# Patient Record
Sex: Female | Born: 1990 | Hispanic: No | Marital: Married | Smoking: Never smoker
Health system: Southern US, Community
[De-identification: ages and names within clinical notes are randomized; demographics above are authoritative.]

## PROBLEM LIST (undated history)

## (undated) ENCOUNTER — Inpatient Hospital Stay (HOSPITAL_COMMUNITY): Payer: Self-pay

## (undated) DIAGNOSIS — G629 Polyneuropathy, unspecified: Secondary | ICD-10-CM

## (undated) HISTORY — PX: CHOLECYSTECTOMY: SHX55

---

## 2011-07-05 ENCOUNTER — Emergency Department (HOSPITAL_COMMUNITY)
Admission: EM | Admit: 2011-07-05 | Discharge: 2011-07-05 | Disposition: A | Discharge status: Home / Self Care | Payer: Medicaid Other | Attending: Emergency Medicine | Admitting: Emergency Medicine

## 2011-07-05 ENCOUNTER — Emergency Department (HOSPITAL_COMMUNITY): Payer: Medicaid Other

## 2011-07-05 ENCOUNTER — Encounter (HOSPITAL_COMMUNITY): Payer: Self-pay

## 2011-07-05 DIAGNOSIS — O210 Mild hyperemesis gravidarum: Secondary | ICD-10-CM | POA: Insufficient documentation

## 2011-07-05 DIAGNOSIS — R5381 Other malaise: Secondary | ICD-10-CM | POA: Insufficient documentation

## 2011-07-05 DIAGNOSIS — N390 Urinary tract infection, site not specified: Secondary | ICD-10-CM | POA: Insufficient documentation

## 2011-07-05 DIAGNOSIS — IMO0001 Reserved for inherently not codable concepts without codable children: Secondary | ICD-10-CM | POA: Insufficient documentation

## 2011-07-05 DIAGNOSIS — O219 Vomiting of pregnancy, unspecified: Secondary | ICD-10-CM

## 2011-07-05 DIAGNOSIS — O2390 Unspecified genitourinary tract infection in pregnancy, unspecified trimester: Secondary | ICD-10-CM | POA: Insufficient documentation

## 2011-07-05 DIAGNOSIS — O2340 Unspecified infection of urinary tract in pregnancy, unspecified trimester: Secondary | ICD-10-CM

## 2011-07-05 LAB — URINALYSIS, ROUTINE W REFLEX MICROSCOPIC
Glucose, UA: NEGATIVE mg/dL
Hgb urine dipstick: NEGATIVE
Ketones, ur: >80 mg/dL — AB
Protein, ur: 30 mg/dL — AB
pH: 6 (ref 5.0–8.0)

## 2011-07-05 LAB — URINE CULTURE: Colony Count: >=100000

## 2011-07-05 LAB — CBC
MCV: 76.7 fL — ABNORMAL LOW (ref 78.0–100.0)
Platelets: 313 10*3/uL (ref 150–400)
RBC: 4.21 MIL/uL (ref 3.87–5.11)
RDW: 14.7 % (ref 11.5–15.5)
WBC: 10.2 10*3/uL (ref 4.0–10.5)

## 2011-07-05 LAB — URINE MICROSCOPIC-ADD ON

## 2011-07-05 LAB — COMPREHENSIVE METABOLIC PANEL
ALT: 8 U/L (ref 0–35)
AST: 14 U/L (ref 0–37)
Albumin: 3.5 g/dL (ref 3.5–5.2)
CO2: 24 mEq/L (ref 19–32)
Chloride: 102 mEq/L (ref 96–112)
Creatinine, Ser: 0.7 mg/dL (ref 0.50–1.10)
GFR calc non Af Amer: >90 mL/min (ref >90)
Potassium: 3.2 mEq/L — ABNORMAL LOW (ref 3.5–5.1)
Sodium: 135 mEq/L (ref 135–145)
Total Bilirubin: 0.3 mg/dL (ref 0.3–1.2)

## 2011-07-05 MED ORDER — ONDANSETRON HCL 4 MG PO TABS: 4.0000 mg | ORAL_TABLET | Freq: Four times a day (QID) | ORAL | Status: DC

## 2011-07-05 MED ORDER — DEXTROSE 5 % IV SOLN
1.0000 g | Freq: Once | INTRAVENOUS | Status: AC
  Administered 2011-07-05: 1 g via INTRAVENOUS
  Filled 2011-07-05: qty 10

## 2011-07-05 MED ORDER — CEPHALEXIN 500 MG PO CAPS: 500.0000 mg | ORAL_CAPSULE | Freq: Four times a day (QID) | ORAL | Status: AC

## 2011-07-05 MED ORDER — POTASSIUM CHLORIDE CRYS ER 20 MEQ PO TBCR
40.0000 meq | EXTENDED_RELEASE_TABLET | Freq: Once | ORAL | Status: AC
  Administered 2011-07-05: 40 meq via ORAL
  Filled 2011-07-05: qty 2

## 2011-07-05 MED ORDER — SODIUM CHLORIDE 0.9 % IV BOLUS (SEPSIS)
1000.0000 mL | Freq: Once | INTRAVENOUS | Status: AC
  Administered 2011-07-05: 1000 mL via INTRAVENOUS

## 2011-07-05 MED ORDER — ONDANSETRON HCL 4 MG/2ML IJ SOLN
4.0000 mg | Freq: Once | INTRAMUSCULAR | Status: AC
  Administered 2011-07-05: 4 mg via INTRAVENOUS
  Filled 2011-07-05: qty 2

## 2011-07-05 MED ORDER — PRENATAL COMPLETE 14-0.4 MG PO TABS: 1.0000 | ORAL_TABLET | Freq: Every day | ORAL | Status: DC

## 2011-07-05 NOTE — ED Notes (Signed)
Pt. States N/V x 3 days.  Pt. Aox 3, Skin warm dry, pink, denies CP, c/o abd pain, no noted respiratory  Distress.

## 2011-07-05 NOTE — ED Notes (Signed)
Ns bolus 1 liter complete

## 2011-07-05 NOTE — ED Notes (Signed)
Pt. Given sprite Po challenge. Visitors at bedside given beverage as well.

## 2011-07-05 NOTE — ED Provider Notes (Signed)
History     CSN: [id number]  Arrival date & time 07/05/11  0113   First MD Initiated Contact with Patient 07/05/11 0120      Chief Complaint  Patient presents with  . Emesis    pt. c/o vomiting x 3 days    (Consider location/radiation/quality/duration/timing/severity/associated sxs/prior treatment) HPI Patient presents with complaint of nausea and vomiting over the past 2-3 days. She states that she generally felt more tired than usual with diffuse body aches and has had multiple episodes of emesis. Emesis nonbloody and nonbilious. She's had no fever or chills. She denies abdominal pain. She also denies vaginal bleeding or discharge. Her last menstrual period was approximately 6 weeks ago. There are no alleviating or modifying factors. There no other associated systemic symptoms. Symptoms are continuous and described as moderate. She's not had similar symptoms previously. There no known sick contacts.  No past medical history on file.  No past surgical history on file.  No family history on file.  History  Substance Use Topics  . Smoking status: Never Smoker   . Smokeless tobacco: Not on file  . Alcohol Use: No    OB History    Grav Para Term Preterm Abortions TAB SAB Ect Mult Living                  Review of Systems ROS reviewed and otherwise negative except for mentioned in HPI  Allergies  Review of patient's allergies indicates no known allergies.  Home Medications   Current Outpatient Rx  Name Route Sig Dispense Refill  . NYQUIL PO Oral Take 30 mLs by mouth once.    . CEPHALEXIN 500 MG PO CAPS Oral Take 1 capsule (500 mg total) by mouth 4 (four) times daily. 28 capsule 0  . ONDANSETRON HCL 4 MG PO TABS Oral Take 1 tablet (4 mg total) by mouth every 6 (six) hours. 12 tablet 0  . PRENATAL COMPLETE 14-0.4 MG PO TABS Oral Take 1 tablet by mouth daily. 60 each 0    BP 94/54  Pulse 62  Temp(Src) 97.8 F (36.6 C) (Oral)  Resp 18  SpO2 100% Vitals  reviewed Physical Exam Physical Examination: General appearance - alert, well appearing, and in no distress Mental status - alert, oriented to person, place, and time Eyes - pupils equal and reactive, no scleral icterus Mouth - mucous membranes moist, pharynx normal without lesions Chest - clear to auscultation, no wheezes, rales or rhonchi, symmetric air entry Heart - normal rate, regular rhythm, normal S1, S2, no murmurs, rubs, clicks or gallops Abdomen - soft, nontender, nondistended, no masses or organomegaly, normal active bowel sounds Musculoskeletal - no joint tenderness, deformity or swelling Extremities - peripheral pulses normal, no pedal edema, no clubbing or cyanosis Skin - normal coloration and turgor, no rashes  ED Course  Procedures (including critical care time)  5:20 AM pt has tolerated fluids, nausea much improved.  No abdominal or flank pain.  U/s shows + IUP.  Urinalysis c/w UTI- pt is nontoxic, afebrile and no flank pain.  Pt has been given rocephin and will be discharged with keflex and zofran.  Pt updated about findings and plan.  She states she is feeling much improved.   Labs Reviewed  URINALYSIS, ROUTINE W REFLEX MICROSCOPIC - Abnormal; Notable for the following:    Color, Urine AMBER (*) BIOCHEMICALS MAY BE AFFECTED BY COLOR   APPearance CLOUDY (*)    Specific Gravity, Urine 1.035 (*)    Bilirubin Urine  SMALL (*)    Ketones, ur >80 (*)    Protein, ur 30 (*)    Nitrite POSITIVE (*)    Leukocytes, UA MODERATE (*)    All other components within normal limits  URINE MICROSCOPIC-ADD ON - Abnormal; Notable for the following:    Squamous Epithelial / LPF MANY (*)    Bacteria, UA MANY (*)    All other components within normal limits  CBC - Abnormal; Notable for the following:    Hemoglobin 10.8 (*)    HCT 32.3 (*)    MCV 76.7 (*)    MCH 25.7 (*)    All other components within normal limits  COMPREHENSIVE METABOLIC PANEL - Abnormal; Notable for the following:     Potassium 3.2 (*)    All other components within normal limits  HCG, QUANTITATIVE, PREGNANCY - Abnormal; Notable for the following:    hCG, Beta Chain, Quant, S 41444 (*)    All other components within normal limits  POCT PREGNANCY, URINE  POCT PREGNANCY, URINE  URINE CULTURE   US Ob Comp Less 14 Wks  07/05/2011  *RADIOLOGY REPORT*  Clinical Data: Vomiting.  OBSTETRIC <14 WK [place] AND TRANSVAGINAL OB US  Technique:  Both transabdominal and transvaginal ultrasound examinations were performed for complete evaluation of the gestation as well as the maternal uterus, adnexal regions, and pelvic cul-de-sac.  Transvaginal technique was performed to assess early pregnancy.  Comparison:  None.  Intrauterine gestational sac:  Visualized/normal in shape. Yolk sac: Yes; the yolk sac is somewhat irregular in appearance. This finding is nonspecific. Embryo: Yes Cardiac Activity: Yes Heart Rate: 123 bpm  CRL: 3.6   mm  6   w  0   d         [place] EDC: 02/28/2012  Maternal uterus/adnexae: No subchorionic hemorrhage is noted.  The uterus is otherwise unremarkable in appearance.  The ovaries are within normal limits.  The right ovary measures 3.8 x 2.6 x 2.9 cm, while the left ovary measures 2.9 x 2.6 x 1.9 cm. No suspicious adnexal masses are seen.  There is no evidence for ovarian torsion.  Trace free fluid is noted within the pelvic cul-de-sac.  IMPRESSION: Single live intrauterine pregnancy noted, with a crown-rump length of 3.6 mm, corresponding to a gestational age of [redacted] weeks 0 days. This matches the gestational age of [redacted] weeks 2 days by LMP, reflecting an estimated date of delivery of February 26, 2012.  Original Report Authenticated By: Tonia Ghent, M.D.   US Ob Transvaginal  07/05/2011  *RADIOLOGY REPORT*  Clinical Data: Vomiting.  OBSTETRIC <14 WK [place] AND TRANSVAGINAL OB US  Technique:  Both transabdominal and transvaginal ultrasound examinations were performed for complete evaluation of the gestation as well as  the maternal uterus, adnexal regions, and pelvic cul-de-sac.  Transvaginal technique was performed to assess early pregnancy.  Comparison:  None.  Intrauterine gestational sac:  Visualized/normal in shape. Yolk sac: Yes; the yolk sac is somewhat irregular in appearance. This finding is nonspecific. Embryo: Yes Cardiac Activity: Yes Heart Rate: 123 bpm  CRL: 3.6   mm  6   w  0   d         [place] EDC: 02/28/2012  Maternal uterus/adnexae: No subchorionic hemorrhage is noted.  The uterus is otherwise unremarkable in appearance.  The ovaries are within normal limits.  The right ovary measures 3.8 x 2.6 x 2.9 cm, while the left ovary measures 2.9 x 2.6 x 1.9 cm. No suspicious  adnexal masses are seen.  There is no evidence for ovarian torsion.  Trace free fluid is noted within the pelvic cul-de-sac.  IMPRESSION: Single live intrauterine pregnancy noted, with a crown-rump length of 3.6 mm, corresponding to a gestational age of [redacted] weeks 0 days. This matches the gestational age of [redacted] weeks 2 days by LMP, reflecting an estimated date of delivery of February 26, 2012.  Original Report Authenticated By: Tonia Ghent, M.D.     1. Urinary tract infection during pregnancy   2. Nausea and vomiting in pregnancy       MDM  Patient presenting with nausea and vomiting. Her urine revealed positive pregnancy. Pelvic ultrasound reveals IUP at approximately 6 weeks. Urinalysis is consistent with cystitis. Patient has no fever or flank pain and nausea and vomiting as well controlled after Zofran. She was given a dose of Rocephin here in the ED and will be discharged with Keflex. I have a low suspicion for pyelonephritis although patient was given strict return precautions to 2 her, current pregnancy. She was given 2 L IV fluids and states that she feels much improved. She'll be discharged with Keflex and Zofran and referred to [facility] for followup. She was also given a prescription for prenatal vitamins. Patient is agreeable with the plan  for discharge and verbalizes understanding.        Ethelda Chick, MD 07/05/11 [phone]

## 2011-07-05 NOTE — ED Notes (Signed)
Pt. Given dc instrucitons and understanding verbalized.

## 2011-07-06 NOTE — ED Notes (Signed)
Pt's wife requesting Neuro consult for husband seen 07/06/11.  Chart reviewed by Dr Estell Harpin and he was not comfortable ordering consult when MD who saw pt didn't feel like pt needed one.  Instructed to f/u w/PCP or call neurology and schedule appt on their own may return as needed to any [facility].

## 2011-07-07 NOTE — ED Notes (Signed)
+   Urine. Patient treated with Keflex. Sensitive to same. Chart appended per protocol MD. °

## 2012-05-29 ENCOUNTER — Encounter (HOSPITAL_COMMUNITY): Payer: Self-pay | Admitting: *Deleted

## 2012-05-29 ENCOUNTER — Ambulatory Visit (HOSPITAL_COMMUNITY)
Admission: EM | Admit: 2012-05-29 | Discharge: 2012-05-29 | Disposition: A | Discharge status: Home / Self Care | Payer: Medicaid Other | Attending: Emergency Medicine | Admitting: Emergency Medicine

## 2012-05-29 DIAGNOSIS — M79609 Pain in unspecified limb: Secondary | ICD-10-CM | POA: Insufficient documentation

## 2012-05-29 DIAGNOSIS — M549 Dorsalgia, unspecified: Secondary | ICD-10-CM | POA: Insufficient documentation

## 2012-05-29 HISTORY — DX: Polyneuropathy, unspecified: G62.9

## 2012-05-29 LAB — URINALYSIS, ROUTINE W REFLEX MICROSCOPIC
Glucose, UA: NEGATIVE mg/dL
Ketones, ur: 40 mg/dL — AB
Protein, ur: 100 mg/dL — AB
Urobilinogen, UA: 0.2 mg/dL (ref 0.0–1.0)

## 2012-05-29 LAB — POCT I-STAT, CHEM 8
BUN: 7 mg/dL (ref 6–23)
Calcium, Ion: 1.21 mmol/L (ref 1.12–1.23)
Chloride: 106 mEq/L (ref 96–112)
Glucose, Bld: 88 mg/dL (ref 70–99)
TCO2: 24 mmol/L (ref 0–100)

## 2012-05-29 LAB — URINE MICROSCOPIC-ADD ON

## 2012-05-29 NOTE — ED Notes (Signed)
Pt is here with left mid back pain that hurts with deep breath and states that she is having bad LE pain.  Pt sts pain with just touching legs.  Pt crying

## 2012-05-30 ENCOUNTER — Encounter (HOSPITAL_COMMUNITY): Payer: Self-pay | Admitting: Emergency Medicine

## 2012-05-30 ENCOUNTER — Emergency Department (HOSPITAL_COMMUNITY)
Admission: EM | Admit: 2012-05-30 | Discharge: 2012-05-30 | Disposition: A | Discharge status: Home / Self Care | Payer: Medicaid Other | Attending: Emergency Medicine | Admitting: Emergency Medicine

## 2012-05-30 ENCOUNTER — Emergency Department (HOSPITAL_COMMUNITY): Payer: Medicaid Other

## 2012-05-30 DIAGNOSIS — R0602 Shortness of breath: Secondary | ICD-10-CM | POA: Insufficient documentation

## 2012-05-30 DIAGNOSIS — R61 Generalized hyperhidrosis: Secondary | ICD-10-CM | POA: Insufficient documentation

## 2012-05-30 DIAGNOSIS — N760 Acute vaginitis: Secondary | ICD-10-CM | POA: Insufficient documentation

## 2012-05-30 DIAGNOSIS — R450 Nervousness: Secondary | ICD-10-CM | POA: Insufficient documentation

## 2012-05-30 DIAGNOSIS — B9689 Other specified bacterial agents as the cause of diseases classified elsewhere: Secondary | ICD-10-CM

## 2012-05-30 DIAGNOSIS — M545 Low back pain, unspecified: Secondary | ICD-10-CM | POA: Insufficient documentation

## 2012-05-30 DIAGNOSIS — N12 Tubulo-interstitial nephritis, not specified as acute or chronic: Secondary | ICD-10-CM | POA: Insufficient documentation

## 2012-05-30 DIAGNOSIS — R109 Unspecified abdominal pain: Secondary | ICD-10-CM | POA: Insufficient documentation

## 2012-05-30 DIAGNOSIS — Z8669 Personal history of other diseases of the nervous system and sense organs: Secondary | ICD-10-CM | POA: Insufficient documentation

## 2012-05-30 LAB — BASIC METABOLIC PANEL WITH GFR
BUN: 6 mg/dL (ref 6–23)
CO2: 21 meq/L (ref 19–32)
Calcium: 8.6 mg/dL (ref 8.4–10.5)
Chloride: 99 meq/L (ref 96–112)
Creatinine, Ser: 0.79 mg/dL (ref 0.50–1.10)
GFR calc Af Amer: >90 mL/min
GFR calc non Af Amer: >90 mL/min
Glucose, Bld: 96 mg/dL (ref 70–99)
Potassium: 3.4 meq/L — ABNORMAL LOW (ref 3.5–5.1)
Sodium: 133 meq/L — ABNORMAL LOW (ref 135–145)

## 2012-05-30 LAB — CBC WITH DIFFERENTIAL/PLATELET
Basophils Absolute: 0 10*3/uL (ref 0.0–0.1)
Basophils Relative: 0 % (ref 0–1)
HCT: 29.6 % — ABNORMAL LOW (ref 36.0–46.0)
MCHC: 32.8 g/dL (ref 30.0–36.0)
Monocytes Absolute: 3.7 10*3/uL — ABNORMAL HIGH (ref 0.1–1.0)
Neutro Abs: 21.7 10*3/uL — ABNORMAL HIGH (ref 1.7–7.7)
Neutrophils Relative %: 83 % — ABNORMAL HIGH (ref 43–77)
Platelets: 317 10*3/uL (ref 150–400)
RDW: 14.7 % (ref 11.5–15.5)

## 2012-05-30 LAB — URINE MICROSCOPIC-ADD ON

## 2012-05-30 LAB — WET PREP, GENITAL
Trich, Wet Prep: NONE SEEN
Yeast Wet Prep HPF POC: NONE SEEN

## 2012-05-30 LAB — URINALYSIS, ROUTINE W REFLEX MICROSCOPIC
Bilirubin Urine: NEGATIVE
Glucose, UA: NEGATIVE mg/dL
Ketones, ur: 40 mg/dL — AB
Nitrite: POSITIVE — AB
Protein, ur: 30 mg/dL — AB
Specific Gravity, Urine: 1.018 (ref 1.005–1.030)
Urobilinogen, UA: 0.2 mg/dL (ref 0.0–1.0)
pH: 6 (ref 5.0–8.0)

## 2012-05-30 MED ORDER — IBUPROFEN 600 MG PO TABS: 600.0000 mg | ORAL_TABLET | Freq: Four times a day (QID) | ORAL | Status: DC | PRN

## 2012-05-30 MED ORDER — METRONIDAZOLE 500 MG PO TABS: 500.0000 mg | ORAL_TABLET | Freq: Two times a day (BID) | ORAL | Status: DC

## 2012-05-30 MED ORDER — DEXTROSE 5 % IV SOLN
1.0000 g | Freq: Once | INTRAVENOUS | Status: AC
  Administered 2012-05-30: 1 g via INTRAVENOUS
  Filled 2012-05-30: qty 10

## 2012-05-30 MED ORDER — MORPHINE SULFATE 4 MG/ML IJ SOLN
4.0000 mg | Freq: Once | INTRAMUSCULAR | Status: AC
  Administered 2012-05-30: 4 mg via INTRAVENOUS
  Filled 2012-05-30: qty 1

## 2012-05-30 MED ORDER — HYDROMORPHONE HCL PF 1 MG/ML IJ SOLN
0.5000 mg | Freq: Once | INTRAMUSCULAR | Status: AC
  Administered 2012-05-30: 0.5 mg via INTRAVENOUS
  Filled 2012-05-30: qty 1

## 2012-05-30 MED ORDER — SODIUM CHLORIDE 0.9 % IV BOLUS (SEPSIS)
1000.0000 mL | Freq: Once | INTRAVENOUS | Status: AC
  Administered 2012-05-30: 1000 mL via INTRAVENOUS

## 2012-05-30 MED ORDER — ONDANSETRON HCL 4 MG PO TABS: 4.0000 mg | ORAL_TABLET | Freq: Four times a day (QID) | ORAL | Status: DC

## 2012-05-30 MED ORDER — CIPROFLOXACIN HCL 500 MG PO TABS: 500.0000 mg | ORAL_TABLET | Freq: Two times a day (BID) | ORAL | Status: DC

## 2012-05-30 MED ORDER — IOHEXOL 300 MG/ML  SOLN
100.0000 mL | Freq: Once | INTRAMUSCULAR | Status: AC | PRN
  Administered 2012-05-30: 100 mL via INTRAVENOUS

## 2012-05-30 NOTE — ED Notes (Signed)
Took pt a cup of apple juice 

## 2012-05-30 NOTE — ED Provider Notes (Signed)
History     CSN: [id number]  Arrival date & time 05/30/12  1236   First MD Initiated Contact with Patient 05/30/12 1251      Chief Complaint  Patient presents with  . Anxiety    (Consider location/radiation/quality/duration/timing/severity/associated sxs/prior treatment) HPI  21 year old female with history of peripheral neuropathy presents with multiple complaints. Patient states for the past 2-3 days she has gradual onset of pain which initially started on her left low back described as a stabbing sensation, persistent, worsening, radiates to the mid abdomen and now down to both of her legs. She describes stabbing sensation to her left leg more so than right, worsened with movement, unable to bear weight. Pain is unrelieved with taking ibuprofen. Pain felt different from history of peripheral neuropathy. She denies similar pain before. She denies fever, chills, chest pain, but does complain of worsening shortness of breath when the pain is intense. No change in her diet. No urinary discomfort or vaginal discharge.  Last menstrual period was December 5, and normal. Per EMS the patient was anxious and diaphoretic upon arrival, Versed 2.5 mg and Zofran 4 mg were given.  Patient reports she tries to followup at [facility] ER yesterday for the same complaint but left after waiting for 2 hours.  Patient denies taking birth control by mouth, no recent surgery, no prolonged bed rest, no prior history of DVT or PE.  Past Medical History  Diagnosis Date  . Peripheral neuropathy     History reviewed. No pertinent past surgical history.  No family history on file.  History  Substance Use Topics  . Smoking status: Never Smoker   . Smokeless tobacco: Not on file  . Alcohol Use: No    OB History    Grav Para Term Preterm Abortions TAB SAB Ect Mult Living                  Review of Systems  All other systems reviewed and are negative.    Allergies  Review of patient's allergies  indicates no known allergies.  Home Medications   Current Outpatient Rx  Name  Route  Sig  Dispense  Refill  . IBUPROFEN 200 MG PO TABS   Oral   Take 800 mg by mouth every 8 (eight) hours as needed. For pain           BP 117/62  Pulse 70  Temp 98.6 F (37 C)  SpO2 99%  LMP 05/15/2012  Physical Exam  Nursing note and vitals reviewed. Constitutional: She is oriented to person, place, and time. She appears well-developed and well-nourished. No distress (tearful, anxious).       Awake, alert, nontoxic appearance  HENT:  Head: Atraumatic.  Mouth/Throat: Oropharynx is clear and moist.  Eyes: Conjunctivae normal are normal. Right eye exhibits no discharge. Left eye exhibits no discharge.  Neck: Neck supple.  Cardiovascular: Normal rate, regular rhythm and intact distal pulses.  Exam reveals no gallop and no friction rub.   No murmur heard. Pulmonary/Chest: Effort normal. No respiratory distress. She exhibits no tenderness.  Abdominal: Soft. Bowel sounds are normal. She exhibits no distension. There is tenderness (Diffuse abdominal tenderness with guarding or no rebound). There is rebound. There is no guarding. Hernia confirmed negative in the right inguinal area and confirmed negative in the left inguinal area.       Left CVA tenderness. No midline spine tenderness  Genitourinary: Uterus normal. Pelvic exam was performed with patient supine. There is no tenderness  on the right labia. There is no tenderness on the left labia. Cervix exhibits no motion tenderness, no discharge and no friability. Right adnexum displays no tenderness and no fullness. Left adnexum displays no tenderness and no fullness. No tenderness or bleeding around the vagina. No foreign body around the vagina. No vaginal discharge found.       Chaperone present  Musculoskeletal: She exhibits no tenderness (Tenderness to bilateral, left greater than right. No edema noted. no palpable cord, erythema).       ROM appears  intact, no obvious focal weakness  Lymphadenopathy:       Right: No inguinal adenopathy present.       Left: No inguinal adenopathy present.  Neurological: She is alert and oriented to person, place, and time. She has normal reflexes.       Mental status and motor strength appears intact  Skin: No rash noted.  Psychiatric: She has a normal mood and affect.    ED Course  Procedures (including critical care time)  Results for orders placed during the hospital encounter of 05/30/12  CBC WITH DIFFERENTIAL      Component Value Range   WBC 26.3 (*) 4.0 - 10.5 K/uL   RBC 3.82 (*) 3.87 - 5.11 MIL/uL   Hemoglobin 9.7 (*) 12.0 - 15.0 g/dL   HCT 46.9 (*) 62.9 - 52.8 %   MCV 77.5 (*) 78.0 - 100.0 fL   MCH 25.4 (*) 26.0 - 34.0 pg   MCHC 32.8  30.0 - 36.0 g/dL   RDW 41.3  24.4 - 01.0 %   Platelets 317  150 - 400 K/uL   Neutrophils Relative 83 (*) 43 - 77 %   Neutro Abs 21.7 (*) 1.7 - 7.7 K/uL   Lymphocytes Relative 3 (*) 12 - 46 %   Lymphs Abs 0.9  0.7 - 4.0 K/uL   Monocytes Relative 14 (*) 3 - 12 %   Monocytes Absolute 3.7 (*) 0.1 - 1.0 K/uL   Eosinophils Relative 0  0 - 5 %   Eosinophils Absolute 0.0  0.0 - 0.7 K/uL   Basophils Relative 0  0 - 1 %   Basophils Absolute 0.0  0.0 - 0.1 K/uL   WBC Morphology ATYPICAL LYMPHOCYTES     Smear Review LARGE PLATELETS PRESENT    BASIC METABOLIC PANEL      Component Value Range   Sodium 133 (*) 135 - 145 mEq/L   Potassium 3.4 (*) 3.5 - 5.1 mEq/L   Chloride 99  96 - 112 mEq/L   CO2 21  19 - 32 mEq/L   Glucose, Bld 96  70 - 99 mg/dL   BUN 6  6 - 23 mg/dL   Creatinine, Ser 2.72  0.50 - 1.10 mg/dL   Calcium 8.6  8.4 - 53.6 mg/dL   GFR calc non Af Amer >90  >90 mL/min   GFR calc Af Amer >90  >90 mL/min  URINALYSIS, ROUTINE W REFLEX MICROSCOPIC      Component Value Range   Color, Urine YELLOW  YELLOW   APPearance CLOUDY (*) CLEAR   Specific Gravity, Urine 1.018  1.005 - 1.030   pH 6.0  5.0 - 8.0   Glucose, UA NEGATIVE  NEGATIVE mg/dL   Hgb  urine dipstick MODERATE (*) NEGATIVE   Bilirubin Urine NEGATIVE  NEGATIVE   Ketones, ur 40 (*) NEGATIVE mg/dL   Protein, ur 30 (*) NEGATIVE mg/dL   Urobilinogen, UA 0.2  0.0 - 1.0 mg/dL  Nitrite POSITIVE (*) NEGATIVE   Leukocytes, UA LARGE (*) NEGATIVE  PREGNANCY, URINE      Component Value Range   Preg Test, Ur NEGATIVE  NEGATIVE  WET PREP, GENITAL      Component Value Range   Yeast Wet Prep HPF POC NONE SEEN  NONE SEEN   Trich, Wet Prep NONE SEEN  NONE SEEN   Clue Cells Wet Prep HPF POC MODERATE (*) NONE SEEN   WBC, Wet Prep HPF POC MANY (*) NONE SEEN  URINE MICROSCOPIC-ADD ON      Component Value Range   Squamous Epithelial / LPF FEW (*) RARE   WBC, UA TOO NUMEROUS TO COUNT  <3 WBC/hpf   Bacteria, UA MANY (*) RARE   Urine-Other MUCOUS PRESENT     Ct Abdomen Pelvis W Contrast  05/30/2012  *RADIOLOGY REPORT*  Clinical Data: Left mid back pain.  CT ABDOMEN AND PELVIS WITH CONTRAST  Technique:  Multidetector CT imaging of the abdomen and pelvis was performed following the standard protocol during bolus administration of intravenous contrast.  Contrast: OMNIPAQUE IOHEXOL 300 MG/ML  SOLN  Comparison: None.  Findings: Lung bases are clear.  No effusions.  Heart is normal size.  Hypodense area within the liver adjacent to the falciform ligament, likely focal fatty infiltration.  Otherwise liver, gallbladder, spleen, pancreas, adrenals and right kidney are unremarkable. Areas of abnormal decreased enhancement within the upper and mid pole of the left kidney compatible with pyelonephritis.  No hydronephrosis.  Bowel grossly unremarkable.  No free fluid, free air, or adenopathy.  Appendix is unremarkable.  Stomach unremarkable. Trace free fluid in the pelvis.  Uterus, adnexa and urinary bladder are unremarkable.  Aorta is normal caliber.  No acute bony abnormality.  IMPRESSION: Left renal upper mid pole pyelonephritis.  These results called to Fayrene Helper at the time of interpretation.          Original Report Authenticated By: Charlett Nose, M.D.      1. Pyelonephritis 2. BV  MDM  PERC negative. Will treat pain, reexamine.  Care discussed with my attending.     2:36 PM Patient felt better after initial pain treatment. Her labs significant with a white count of 26.3 with a left shift. Her abdomen is tender on initial exam. She does have some lower abdominal tenderness on exam as well. Will perform pelvic exam and will follow up with an abdomen and pelvis CT scan for the evaluation.  2:51 PM Pelvic examination is unremarkable. No evidence of cervical motion tenderness.  3:40 PM UA shows evidence of urinary tract infection. Wet prep shows moderate clue cells.  4:56 PM Pt continue to endorse worsening back and abd pain.  Will give pain meds and will continue to monitor.  Her VSS.  She is afebrile, able to tolerates PO.   6:00 PM CT shows L pyelonephritis.  Rocephin given.  Will continue management.  6:57 PM Patient reports she feels much better after receiving treatment. She is able to tolerates by mouth. She feels stable enough to return home. Will treat Pyelo with cipro x 1 week and BV with flagyl.  Return precaution given.      BP 120/52  Pulse 70  Temp 98.6 F (37 C)  SpO2 99%  LMP 05/15/2012  I have reviewed nursing notes and vital signs. I personally reviewed the imaging tests through PACS system  I reviewed available ER/hospitalization records thought the EMR   Fayrene Helper, [place] 05/30/12 1900

## 2012-05-30 NOTE — ED Notes (Signed)
ZOX:[id number]<AV> Expected date:<BR> Expected time:<BR> Means of arrival:Ambulance<BR> Comments:<BR> Anxiety/was given versed and zofran

## 2012-05-30 NOTE — ED Notes (Signed)
Patient transported to CT 

## 2012-05-30 NOTE — ED Notes (Signed)
Per EMS: Versed 2.5mg  given, Zofran 4 mg. Pt presents with anxiety attack. Was drenched in sweat when EMS arrived gave medication and calmed and cooled pt down. 20 gauge in left forearm.

## 2012-05-31 LAB — URINE CULTURE: Colony Count: >=100000

## 2012-05-31 NOTE — ED Provider Notes (Signed)
Medical screening examination/treatment/procedure(s) were performed by non-physician practitioner and as supervising physician I was immediately available for consultation/collaboration.   Lyanne Co, MD 05/31/12 [phone]

## 2012-06-01 LAB — URINE CULTURE

## 2012-06-01 LAB — GC/CHLAMYDIA PROBE AMP: CT Probe RNA: POSITIVE — AB

## 2012-06-02 NOTE — ED Notes (Signed)
+  Chlamydia. Chart sent to EDP office for review. DHHS attached. 

## 2012-06-02 NOTE — ED Notes (Signed)
+  Urine. Patient treated with Cipro. Sensitive to same. Per protocol MD. °

## 2012-06-03 ENCOUNTER — Telehealth (HOSPITAL_COMMUNITY): Payer: Self-pay | Admitting: Emergency Medicine

## 2012-06-03 NOTE — ED Notes (Signed)
Chart returned from EDP office. Prescribed Azithromycin 1 gram PO x 1. No refills. Prescribed by Dahlia Client Muthersbaugh PA-C.

## 2012-06-06 NOTE — ED Notes (Signed)
Unavailable-Letter sent to [facility] address.

## 2012-07-23 ENCOUNTER — Telehealth (HOSPITAL_COMMUNITY): Payer: Self-pay | Admitting: Emergency Medicine

## 2012-07-23 NOTE — ED Notes (Signed)
No response to letter sent after 30 days. Chart sent to Medical Records. °

## 2012-12-31 ENCOUNTER — Encounter (HOSPITAL_COMMUNITY): Payer: Self-pay | Admitting: Emergency Medicine

## 2012-12-31 ENCOUNTER — Emergency Department (HOSPITAL_COMMUNITY)
Admission: EM | Admit: 2012-12-31 | Discharge: 2012-12-31 | Disposition: A | Discharge status: Home / Self Care | Payer: Medicaid Other | Attending: Emergency Medicine | Admitting: Emergency Medicine

## 2012-12-31 DIAGNOSIS — M79609 Pain in unspecified limb: Secondary | ICD-10-CM | POA: Insufficient documentation

## 2012-12-31 DIAGNOSIS — G629 Polyneuropathy, unspecified: Secondary | ICD-10-CM

## 2012-12-31 DIAGNOSIS — G608 Other hereditary and idiopathic neuropathies: Secondary | ICD-10-CM | POA: Insufficient documentation

## 2012-12-31 MED ORDER — TRAMADOL HCL 50 MG PO TABS: 50.0000 mg | ORAL_TABLET | Freq: Four times a day (QID) | ORAL | Status: DC | PRN

## 2012-12-31 MED ORDER — TRAMADOL HCL 50 MG PO TABS
50.0000 mg | ORAL_TABLET | Freq: Once | ORAL | Status: AC
  Administered 2012-12-31: 50 mg via ORAL
  Filled 2012-12-31: qty 1

## 2012-12-31 NOTE — ED Notes (Signed)
Pt states that she has hx of peripheral neuropathy bilaterally in both LE. Pt has been taking ibuprofen. Pt states that she is not taking any prescribed meds and the pain has gotten worse over the last two days.

## 2012-12-31 NOTE — ED Provider Notes (Signed)
History  This chart was scribed for non-physician practitioner Roxy Horseman, PA-C working with Raeford Razor, MD, by Candelaria Stagers, ED Scribe. This patient was seen in room WTR9/WTR9 and the patient's care was started at 11:03 PM  CSN: [id number] Arrival date & time 12/31/12  2235  First MD Initiated Contact with Patient 12/31/12 2244     Chief Complaint  Patient presents with  . Leg Pain  . Peripheral Neuropathy   The history is provided by the patient. No language interpreter was used.   HPI Comments: Kathleen Rice is a 22 y.o. female with h/o peripheral neuropathy to bilateral LE who presents to the Emergency Department complaining of recurrent bilateral leg pain that started two days ago.  Pt reports her left leg is worse than right.  Pt has taken ibuprofen and advil with no relief.  Pt's previous neurologist is out of town and she requests a referral to a neurologist in [place].     Past Medical History  Diagnosis Date  . Peripheral neuropathy    History reviewed. No pertinent past surgical history. No family history on file. History  Substance Use Topics  . Smoking status: Never Smoker   . Smokeless tobacco: Never Used  . Alcohol Use: No   OB History   Grav Para Term Preterm Abortions TAB SAB Ect Mult Living                 Review of Systems  Musculoskeletal:       Bilateral leg pain  All other systems reviewed and are negative.    Allergies  Review of patient's allergies indicates no known allergies.  Home Medications   Current Outpatient Rx  Name  Route  Sig  Dispense  Refill  . ciprofloxacin (CIPRO) 500 MG tablet   Oral   Take 1 tablet (500 mg total) by mouth 2 (two) times daily.   14 tablet   0   . ibuprofen (ADVIL,MOTRIN) 600 MG tablet   Oral   Take 1 tablet (600 mg total) by mouth every 6 (six) hours as needed for pain.   30 tablet   0   . metroNIDAZOLE (FLAGYL) 500 MG tablet   Oral   Take 1 tablet (500 mg total) by mouth 2  (two) times daily.   14 tablet   0   . ondansetron (ZOFRAN) 4 MG tablet   Oral   Take 1 tablet (4 mg total) by mouth every 6 (six) hours.   12 tablet   0    BP 127/77  Pulse 76  Temp(Src) 98.3 F (36.8 C) (Oral)  Resp 19  Ht 5\' 3"  (1.6 m)  Wt 145 lb (65.772 kg)  BMI 25.69 kg/m2  SpO2 100%  LMP 12/24/2012 Physical Exam  Nursing note and vitals reviewed. Constitutional: She is oriented to person, place, and time. She appears well-developed and well-nourished.  Pt is tearful  HENT:  Head: Normocephalic and atraumatic.  Eyes: EOM are normal.  Neck: Neck supple. No tracheal deviation present.  Cardiovascular: Normal rate.   Pulmonary/Chest: Effort normal. No respiratory distress.  Musculoskeletal: Normal range of motion.  Moves all extremities  Neurological: She is alert and oriented to person, place, and time.  Sensation and strength intact  Skin: Skin is warm and dry.  Psychiatric: She has a normal mood and affect. Her behavior is normal.    ED Course  Procedures   DIAGNOSTIC STUDIES: Oxygen Saturation is 100% on room air, normal by my interpretation.  COORDINATION OF CARE:  11:07 PM Discussed course of care with pt which includes ultram.  Will provide referral to neurologist.  Pt understands and agrees.   Labs Reviewed - No data to display No results found. 1. Peripheral neuropathy     MDM  Patient with peripheral neuropathy from cheer leading, previously diagnosed by neuro.  She requests a referral to neuro, and some medication to help.  Will give ultram, for pain.  Patient is stable and ready for discharge.  I personally performed the services described in this documentation, which was scribed in my presence. The recorded information has been reviewed and is accurate.     Roxy Horseman, PA-C 12/31/12 2350

## 2013-01-01 ENCOUNTER — Encounter (HOSPITAL_COMMUNITY): Payer: Self-pay | Admitting: Emergency Medicine

## 2013-01-01 ENCOUNTER — Emergency Department (HOSPITAL_COMMUNITY): Payer: Medicaid Other

## 2013-01-01 ENCOUNTER — Emergency Department (HOSPITAL_COMMUNITY)
Admission: EM | Admit: 2013-01-01 | Discharge: 2013-01-01 | Disposition: A | Discharge status: Home / Self Care | Payer: Medicaid Other | Attending: Emergency Medicine | Admitting: Emergency Medicine

## 2013-01-01 DIAGNOSIS — R319 Hematuria, unspecified: Secondary | ICD-10-CM | POA: Insufficient documentation

## 2013-01-01 DIAGNOSIS — Z3202 Encounter for pregnancy test, result negative: Secondary | ICD-10-CM | POA: Insufficient documentation

## 2013-01-01 DIAGNOSIS — Z8669 Personal history of other diseases of the nervous system and sense organs: Secondary | ICD-10-CM | POA: Insufficient documentation

## 2013-01-01 DIAGNOSIS — N760 Acute vaginitis: Secondary | ICD-10-CM | POA: Insufficient documentation

## 2013-01-01 DIAGNOSIS — N390 Urinary tract infection, site not specified: Secondary | ICD-10-CM

## 2013-01-01 DIAGNOSIS — B9689 Other specified bacterial agents as the cause of diseases classified elsewhere: Secondary | ICD-10-CM

## 2013-01-01 LAB — CBC WITH DIFFERENTIAL/PLATELET
Lymphocytes Relative: 23 % (ref 12–46)
Lymphs Abs: 1.9 10*3/uL (ref 0.7–4.0)
MCV: 81 fL (ref 78.0–100.0)
Neutro Abs: 5.4 10*3/uL (ref 1.7–7.7)
Neutrophils Relative %: 63 % (ref 43–77)
Platelets: 408 10*3/uL — ABNORMAL HIGH (ref 150–400)
RBC: 5 MIL/uL (ref 3.87–5.11)
WBC: 8.5 10*3/uL (ref 4.0–10.5)

## 2013-01-01 LAB — URINALYSIS, ROUTINE W REFLEX MICROSCOPIC
Protein, ur: NEGATIVE mg/dL
Urobilinogen, UA: 0.2 mg/dL (ref 0.0–1.0)

## 2013-01-01 LAB — WET PREP, GENITAL

## 2013-01-01 LAB — URINE MICROSCOPIC-ADD ON

## 2013-01-01 LAB — BASIC METABOLIC PANEL
CO2: 27 mEq/L (ref 19–32)
Chloride: 101 mEq/L (ref 96–112)
Glucose, Bld: 84 mg/dL (ref 70–99)
Potassium: 3.7 mEq/L (ref 3.5–5.1)
Sodium: 136 mEq/L (ref 135–145)

## 2013-01-01 LAB — POCT PREGNANCY, URINE: Preg Test, Ur: NEGATIVE

## 2013-01-01 MED ORDER — MORPHINE SULFATE 4 MG/ML IJ SOLN
4.0000 mg | Freq: Once | INTRAMUSCULAR | Status: AC
  Administered 2013-01-01: 4 mg via INTRAVENOUS
  Filled 2013-01-01: qty 1

## 2013-01-01 MED ORDER — METRONIDAZOLE 500 MG PO TABS
2000.0000 mg | ORAL_TABLET | Freq: Once | ORAL | Status: AC
  Administered 2013-01-01: 2000 mg via ORAL
  Filled 2013-01-01: qty 4

## 2013-01-01 MED ORDER — ONDANSETRON 4 MG PO TBDP: 4.0000 mg | ORAL_TABLET | Freq: Once | ORAL | Status: DC

## 2013-01-01 MED ORDER — OXYCODONE-ACETAMINOPHEN 5-325 MG PO TABS: 2.0000 | ORAL_TABLET | Freq: Once | ORAL | Status: DC

## 2013-01-01 MED ORDER — CEFTRIAXONE SODIUM 1 G IJ SOLR
1.0000 g | Freq: Once | INTRAMUSCULAR | Status: AC
  Administered 2013-01-01: 1 g via INTRAMUSCULAR
  Filled 2013-01-01: qty 10

## 2013-01-01 MED ORDER — HYDROCODONE-ACETAMINOPHEN 5-325 MG PO TABS: ORAL_TABLET | ORAL | Status: DC

## 2013-01-01 MED ORDER — LIDOCAINE HCL (PF) 1 % IJ SOLN
INTRAMUSCULAR | Status: AC
  Filled 2013-01-01: qty 5

## 2013-01-01 MED ORDER — ONDANSETRON HCL 4 MG/2ML IJ SOLN
4.0000 mg | Freq: Once | INTRAMUSCULAR | Status: AC
  Administered 2013-01-01: 4 mg via INTRAVENOUS
  Filled 2013-01-01: qty 2

## 2013-01-01 MED ORDER — SULFAMETHOXAZOLE-TRIMETHOPRIM 800-160 MG PO TABS: 1.0000 | ORAL_TABLET | Freq: Two times a day (BID) | ORAL | Status: DC

## 2013-01-01 NOTE — ED Provider Notes (Signed)
History    CSN: [id number] Arrival date & time 01/01/13  1608  First MD Initiated Contact with Patient 01/01/13 1731     Chief Complaint  Patient presents with  . Abdominal Pain  . Vaginal Discharge  . Hematuria   (Consider location/radiation/quality/duration/timing/severity/associated sxs/prior Treatment) HPI  Kathleen Rice is a(n) 22 y.o. female who presents chief complaint of hematuria, flank pain and abdominal pain for the past 2 days.  Patient has a history of urinary tract infections.  He is had worsening left flank pain and bilateral lower quadrant abdominal pain over the past 2 days.  She denies any fevers, chills, nausea, vomiting.  She has a history of kidney stones.  Patient is sexually active with one partner and does not use protection.  Past Medical History  Diagnosis Date  . Peripheral neuropathy    History reviewed. No pertinent past surgical history. History reviewed. No pertinent family history. History  Substance Use Topics  . Smoking status: Never Smoker   . Smokeless tobacco: Never Used  . Alcohol Use: No   OB History   Grav Para Term Preterm Abortions TAB SAB Ect Mult Living                 Review of Systems  Allergies  Review of patient's allergies indicates no known allergies.  Home Medications   Current Outpatient Rx  Name  Route  Sig  Dispense  Refill  . ibuprofen (ADVIL,MOTRIN) 200 MG tablet   Oral   Take 400 mg by mouth every 6 (six) hours as needed for pain.         . traMADol (ULTRAM) 50 MG tablet   Oral   Take 1 tablet (50 mg total) by mouth every 6 (six) hours as needed for pain.   15 tablet   0    BP 113/57  Pulse 75  Temp(Src) 98.2 F (36.8 C) (Oral)  Resp 18  SpO2 99%  LMP 12/24/2012 Physical Exam Physical Exam  Nursing note and vitals reviewed. Constitutional: She is oriented to person, place, and time. She appears well-developed and well-nourished. No distress.  HENT:  Head: Normocephalic and atraumatic.   Eyes: Conjunctivae normal and EOM are normal. Pupils are equal, round, and reactive to light. No scleral icterus.  Neck: Normal range of motion.  Cardiovascular: Normal rate, regular rhythm and normal heart sounds.  Exam reveals no gallop and no friction rub.   No murmur heard. Pulmonary/Chest: Effort normal and breath sounds normal. No respiratory distress.  Abdominal: Tenderness bilateral lower abdominal quadrants.  Positive CVA tenderness on the left. Neurological: She is alert and oriented to person, place, and time.  Skin: Skin is warm and dry. She is not diaphoretic.  Pelvic exam: VULVA: normal appearing vulva with no masses, tenderness or lesions, VAGINA: normal appearing vagina with normal color and discharge, no lesions, CERVIX: normal appearing cervix without discharge or lesions, UTERUS: uterus is normal size, shape, consistency and nontender, ADNEXA: tenderness bilateral, worse on Left. Right sided adnexal fullness.    ED Course  Procedures (including critical care time) Labs Reviewed  WET PREP, GENITAL - Abnormal; Notable for the following:    Clue Cells Wet Prep HPF POC MANY (*)    WBC, Wet Prep HPF POC FEW (*)    All other components within normal limits  URINALYSIS, ROUTINE W REFLEX MICROSCOPIC - Abnormal; Notable for the following:    APPearance CLOUDY (*)    Leukocytes, UA MODERATE (*)    All  other components within normal limits  URINE MICROSCOPIC-ADD ON - Abnormal; Notable for the following:    Squamous Epithelial / LPF MANY (*)    Bacteria, UA MANY (*)    All other components within normal limits  URINE CULTURE  GC/CHLAMYDIA PROBE AMP  CBC WITH DIFFERENTIAL  BASIC METABOLIC PANEL  POCT PREGNANCY, URINE   No results found. 1. UTI (lower urinary tract infection)   2. BV (bacterial vaginosis)     MDM  7:20 PM Patient with UTI.  Consercn b/c of adnexal fullness. Labs are pending. Transvaginal US pending.  9:32 PM Patient with negative vaginal [place]. Pain  relieved with pain meds. + UTI and BV. No cervicitis or overt concern for STD. GC?CHlamydia pending\ Patient given ceftriaxone and flagyl in the ED. Will discharge with bactrim. F/u with [facility]. Discussed return precaustions including sxs of appendicitis.   Arthor Captain, PA-C 01/01/13 2141

## 2013-01-01 NOTE — ED Notes (Addendum)
Pt c/o lower abd pain with hematuria and vaginal discharge x 2 days; pt sts could have STD

## 2013-01-02 NOTE — ED Provider Notes (Signed)
Medical screening examination/treatment/procedure(s) were performed by non-physician practitioner and as supervising physician I was immediately available for consultation/collaboration.   Carleene Cooper III, MD 01/02/13 1254

## 2013-01-03 NOTE — ED Notes (Signed)
+   Gonorrhea Patient treated with Rocephin 1 g -[facility] letter faxed

## 2013-01-04 ENCOUNTER — Telehealth (HOSPITAL_COMMUNITY): Payer: Self-pay | Admitting: *Deleted

## 2013-01-04 LAB — URINE CULTURE

## 2013-01-04 NOTE — ED Provider Notes (Signed)
Medical screening examination/treatment/procedure(s) were performed by non-physician practitioner and as supervising physician I was immediately available for consultation/collaboration.  Mercury Rock, MD 01/04/13 2325 

## 2013-01-04 NOTE — ED Notes (Signed)
Patient informed of positive results after id'd x 2 and informed of need to notify partner to be treated. 

## 2013-01-16 ENCOUNTER — Ambulatory Visit: Payer: Medicaid Other

## 2013-01-29 ENCOUNTER — Emergency Department (HOSPITAL_COMMUNITY): Payer: Medicaid Other

## 2013-01-29 ENCOUNTER — Emergency Department (HOSPITAL_COMMUNITY)
Admission: EM | Admit: 2013-01-29 | Discharge: 2013-01-29 | Disposition: A | Discharge status: Home / Self Care | Payer: Medicaid Other | Attending: Emergency Medicine | Admitting: Emergency Medicine

## 2013-01-29 ENCOUNTER — Encounter (HOSPITAL_COMMUNITY): Payer: Self-pay | Admitting: *Deleted

## 2013-01-29 DIAGNOSIS — S0990XA Unspecified injury of head, initial encounter: Secondary | ICD-10-CM | POA: Insufficient documentation

## 2013-01-29 DIAGNOSIS — S0993XA Unspecified injury of face, initial encounter: Secondary | ICD-10-CM | POA: Insufficient documentation

## 2013-01-29 DIAGNOSIS — R110 Nausea: Secondary | ICD-10-CM

## 2013-01-29 DIAGNOSIS — IMO0002 Reserved for concepts with insufficient information to code with codable children: Secondary | ICD-10-CM | POA: Insufficient documentation

## 2013-01-29 DIAGNOSIS — Z8669 Personal history of other diseases of the nervous system and sense organs: Secondary | ICD-10-CM | POA: Insufficient documentation

## 2013-01-29 DIAGNOSIS — R209 Unspecified disturbances of skin sensation: Secondary | ICD-10-CM | POA: Insufficient documentation

## 2013-01-29 DIAGNOSIS — R42 Dizziness and giddiness: Secondary | ICD-10-CM | POA: Insufficient documentation

## 2013-01-29 DIAGNOSIS — Y92410 Unspecified street and highway as the place of occurrence of the external cause: Secondary | ICD-10-CM | POA: Insufficient documentation

## 2013-01-29 DIAGNOSIS — Z79899 Other long term (current) drug therapy: Secondary | ICD-10-CM | POA: Insufficient documentation

## 2013-01-29 DIAGNOSIS — R519 Headache, unspecified: Secondary | ICD-10-CM

## 2013-01-29 DIAGNOSIS — M549 Dorsalgia, unspecified: Secondary | ICD-10-CM

## 2013-01-29 DIAGNOSIS — Y9389 Activity, other specified: Secondary | ICD-10-CM | POA: Insufficient documentation

## 2013-01-29 MED ORDER — ONDANSETRON 4 MG PO TBDP
4.0000 mg | ORAL_TABLET | Freq: Once | ORAL | Status: AC
  Administered 2013-01-29: 4 mg via ORAL
  Filled 2013-01-29: qty 1

## 2013-01-29 MED ORDER — OXYCODONE-ACETAMINOPHEN 5-325 MG PO TABS
2.0000 | ORAL_TABLET | Freq: Once | ORAL | Status: AC
  Administered 2013-01-29: 2 via ORAL
  Filled 2013-01-29: qty 2

## 2013-01-29 MED ORDER — DIAZEPAM 5 MG PO TABS: 5.0000 mg | ORAL_TABLET | Freq: Two times a day (BID) | ORAL | Status: DC

## 2013-01-29 NOTE — ED Provider Notes (Signed)
Medical screening examination/treatment/procedure(s) were performed by non-physician practitioner and as supervising physician I was immediately available for consultation/collaboration.    Nelia Shi, MD 01/29/13 [phone]
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
## 2013-01-29 NOTE — ED Notes (Signed)
Patient transported to CT 
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
## 2013-02-06 ENCOUNTER — Ambulatory Visit: Payer: Medicaid Other

## 2013-03-26 ENCOUNTER — Encounter (HOSPITAL_COMMUNITY): Payer: Self-pay | Admitting: Emergency Medicine

## 2013-03-26 ENCOUNTER — Emergency Department (HOSPITAL_COMMUNITY): Payer: Medicaid Other

## 2013-03-26 ENCOUNTER — Emergency Department (HOSPITAL_COMMUNITY)
Admission: EM | Admit: 2013-03-26 | Discharge: 2013-03-26 | Disposition: A | Discharge status: Home / Self Care | Payer: Medicaid Other | Attending: Emergency Medicine | Admitting: Emergency Medicine

## 2013-03-26 DIAGNOSIS — J069 Acute upper respiratory infection, unspecified: Secondary | ICD-10-CM | POA: Insufficient documentation

## 2013-03-26 DIAGNOSIS — G43909 Migraine, unspecified, not intractable, without status migrainosus: Secondary | ICD-10-CM

## 2013-03-26 DIAGNOSIS — J029 Acute pharyngitis, unspecified: Secondary | ICD-10-CM | POA: Insufficient documentation

## 2013-03-26 DIAGNOSIS — Z3202 Encounter for pregnancy test, result negative: Secondary | ICD-10-CM | POA: Insufficient documentation

## 2013-03-26 DIAGNOSIS — R0602 Shortness of breath: Secondary | ICD-10-CM | POA: Insufficient documentation

## 2013-03-26 LAB — POCT I-STAT, CHEM 8
BUN: 14 mg/dL (ref 6–23)
Chloride: 103 mEq/L (ref 96–112)
HCT: 39 % (ref 36.0–46.0)
Sodium: 141 mEq/L (ref 135–145)
TCO2: 27 mmol/L (ref 0–100)

## 2013-03-26 LAB — URINALYSIS, ROUTINE W REFLEX MICROSCOPIC
Glucose, UA: NEGATIVE mg/dL
Hgb urine dipstick: NEGATIVE
Specific Gravity, Urine: 1.027 (ref 1.005–1.030)
Urobilinogen, UA: 1 mg/dL (ref 0.0–1.0)

## 2013-03-26 LAB — POCT PREGNANCY, URINE: Preg Test, Ur: NEGATIVE

## 2013-03-26 MED ORDER — BUTALBITAL-APAP-CAFFEINE 50-325-40 MG PO TABS: 1.0000 | ORAL_TABLET | Freq: Four times a day (QID) | ORAL | Status: DC | PRN

## 2013-03-26 NOTE — ED Notes (Signed)
Pt c/o increased fatigue especially with exertion; pt sts some SOB this am and recent URI sx; pt sts some HA recently but denies at present

## 2013-03-26 NOTE — ED Provider Notes (Signed)
TIME SEEN: 4:34 PM  CHIEF COMPLAINT: Fatigue,  weakness, headaches, cough, sore throat, shortness of breath with exertion  HPI: Patient is a 22 year old female with a history of peripheral neuropathy, migraine headaches who presents emergency department with 2-3 days of frequent her typical migraine headaches, shortness of breath with exertion, dry cough, sore throat, generalized fatigue and weakness. She denies any sick contacts or recent travel. She's not had any fevers that she is aware of but states she has had chills. No vomiting or diarrhea. She states she has heavy menstrual cycles that last 5-6 days and she changes tampons 4-6 times a day.  No known history of anemia. No history of recent prolonged immobilization, fracture, trauma, surgery. Patient is not on exogenous estrogens and does not use tobacco products.  She denies having any headache currently. She states when she does have headaches, they are typical of her migraines and not associated with neck pain or neck stiffness, numbness, tingling or focal weakness.  ROS: See HPI Constitutional: no fever  Eyes: no drainage  ENT: no runny nose   Cardiovascular:  no chest pain  Resp: SOB  GI: no vomiting GU: no dysuria Integumentary: no rash  Allergy: no hives  Musculoskeletal: no leg swelling  Neurological: no slurred speech ROS otherwise negative  PAST MEDICAL HISTORY/PAST SURGICAL HISTORY:  Past Medical History  Diagnosis Date  . Peripheral neuropathy     MEDICATIONS:  Prior to Admission medications   Medication Sig Start Date End Date Taking? Authorizing Provider  ibuprofen (ADVIL,MOTRIN) 200 MG tablet Take 400 mg by mouth every 6 (six) hours as needed for pain.   Yes Historical Provider, MD  traMADol (ULTRAM) 50 MG tablet Take 1 tablet (50 mg total) by mouth every 6 (six) hours as needed for pain. 12/31/12  Yes Roxy Horseman, PA-C    ALLERGIES:  No Known Allergies  SOCIAL HISTORY:  History  Substance Use Topics  .  Smoking status: Never Smoker   . Smokeless tobacco: Never Used  . Alcohol Use: No    FAMILY HISTORY: History reviewed. No pertinent family history.  EXAM: BP 125/65  Pulse 83  Temp(Src) 98.5 F (36.9 C) (Oral)  Resp 18  Ht 5\' 4"  (1.626 m)  Wt 169 lb 1.6 oz (76.703 kg)  BMI 29.01 kg/m2  SpO2 100%  LMP 03/12/2013 CONSTITUTIONAL: Alert and oriented and responds appropriately to questions. Well-appearing; well-nourished HEAD: Normocephalic EYES: Conjunctivae clear, PERRL; she has conjunctival pallor ENT: normal nose; no rhinorrhea; moist mucous membranes; pharynx without lesions noted; no tonsillar exudate or hypertrophy, no uvular deviation NECK: Supple, no meningismus, no LAD  CARD: RRR; S1 and S2 appreciated; no murmurs, no clicks, no rubs, no gallops RESP: Normal chest excursion without splinting or tachypnea; breath sounds clear and equal bilaterally; no wheezes, no rhonchi, no rales,  ABD/GI: Normal bowel sounds; non-distended; soft, non-tender, no rebound, no guarding BACK:  The back appears normal and is non-tender to palpation, there is no CVA tenderness EXT: Normal ROM in all joints; non-tender to palpation; no edema; normal capillary refill; no cyanosis    SKIN: Normal color for age and race; warm NEURO: Moves all extremities equally; sensation to light touch intact diffusely, CN 2-12 intact, normal gait PSYCH: The patient's mood and manner are appropriate. Grooming and personal hygiene are appropriate.  MEDICAL DECISION MAKING: Pt with viral symptoms.  Chest x-ray, urinalysis unremarkable. Urine pregnancy test negative.  Given her conjunctival pallor and heavy menstrual cycles, will obtain H/H.  Will also check  electrolytes.  I am not concerned for any life-threatening illness. She has no risk factors for ACS. She is PERC negative.  Anticipate discharge home.  ED PROGRESS: Labs are unremarkable. Hemoglobin is 13.3. Glucose is only slightly low at 65 but suspect this is  normal for her and she reports she has not eaten in several hours. Given orange juice and crackers which patient tolerated well. She has not diabetic or on anti-hyperglycemics.  Will discharge home with prescription for Fioricet for her frequent migraines, instructions for supportive care, return precautions and PCP followup. Patient verbalizes understanding is comfortable with this plan.     Layla Maw Seba Madole, DO 03/26/13 1820

## 2013-04-17 ENCOUNTER — Encounter (HOSPITAL_COMMUNITY): Payer: Self-pay | Admitting: Emergency Medicine

## 2013-04-17 ENCOUNTER — Emergency Department (HOSPITAL_COMMUNITY)
Admission: EM | Admit: 2013-04-17 | Discharge: 2013-04-17 | Disposition: A | Discharge status: Home / Self Care | Payer: Medicaid Other | Attending: Emergency Medicine | Admitting: Emergency Medicine

## 2013-04-17 DIAGNOSIS — R1032 Left lower quadrant pain: Secondary | ICD-10-CM | POA: Insufficient documentation

## 2013-04-17 DIAGNOSIS — Z3202 Encounter for pregnancy test, result negative: Secondary | ICD-10-CM | POA: Insufficient documentation

## 2013-04-17 DIAGNOSIS — Z8669 Personal history of other diseases of the nervous system and sense organs: Secondary | ICD-10-CM | POA: Insufficient documentation

## 2013-04-17 DIAGNOSIS — Z202 Contact with and (suspected) exposure to infections with a predominantly sexual mode of transmission: Secondary | ICD-10-CM

## 2013-04-17 LAB — POCT PREGNANCY, URINE: Preg Test, Ur: NEGATIVE

## 2013-04-17 LAB — URINALYSIS, ROUTINE W REFLEX MICROSCOPIC
Bilirubin Urine: NEGATIVE
Glucose, UA: NEGATIVE mg/dL
Hgb urine dipstick: NEGATIVE
Protein, ur: NEGATIVE mg/dL
Urobilinogen, UA: 1 mg/dL (ref 0.0–1.0)
pH: 8 (ref 5.0–8.0)

## 2013-04-17 LAB — WET PREP, GENITAL: Trich, Wet Prep: NONE SEEN

## 2013-04-17 MED ORDER — AZITHROMYCIN 250 MG PO TABS
1000.0000 mg | ORAL_TABLET | Freq: Once | ORAL | Status: AC
  Administered 2013-04-17: 1000 mg via ORAL
  Filled 2013-04-17: qty 4

## 2013-04-17 MED ORDER — METRONIDAZOLE 500 MG PO TABS: 500.0000 mg | ORAL_TABLET | Freq: Two times a day (BID) | ORAL | Status: DC

## 2013-04-17 MED ORDER — LIDOCAINE HCL 1 % IJ SOLN
2.1000 mL | Freq: Once | INTRAMUSCULAR | Status: AC
  Administered 2013-04-17: 2.1 mL
  Filled 2013-04-17: qty 20

## 2013-04-17 MED ORDER — CEFTRIAXONE SODIUM 1 G IJ SOLR
1.0000 g | Freq: Once | INTRAMUSCULAR | Status: AC
  Administered 2013-04-17: 1 g via INTRAMUSCULAR
  Filled 2013-04-17: qty 10

## 2013-04-17 MED ORDER — IBUPROFEN 800 MG PO TABS
800.0000 mg | ORAL_TABLET | Freq: Once | ORAL | Status: AC
  Administered 2013-04-17: 800 mg via ORAL
  Filled 2013-04-17: qty 1

## 2013-04-17 MED ORDER — IBUPROFEN 800 MG PO TABS: 800.0000 mg | ORAL_TABLET | Freq: Three times a day (TID) | ORAL | Status: DC

## 2013-04-17 NOTE — ED Provider Notes (Signed)
CSN: [id number]     Arrival date & time 04/17/13  1529 History   First MD Initiated Contact with Patient 04/17/13 1544     Chief Complaint  Patient presents with  . Exposure to STD   (Consider location/radiation/quality/duration/timing/severity/associated sxs/prior Treatment) Patient is a 22 y.o. female presenting with STD exposure. The history is provided by the patient and medical records. No language interpreter was used.  Exposure to STD This is a new problem. The current episode started in the past 7 days. The problem occurs intermittently. The problem has been unchanged. Associated symptoms include abdominal pain (LLQ). Pertinent negatives include no chest pain, coughing, diaphoresis, fatigue, fever, nausea, neck pain, rash, vomiting or weakness. Nothing aggravates the symptoms. She has tried nothing for the symptoms.    Kathleen Rice is a 22 y.o. female  with a hx of peripheral neuropathy presents to the Emergency Department complaining of concerns about STD exposure after having unprotected sex this past weekend. Pt reports only 1 female sexual partner in the past 6 months, but this is the first unprotected encounter.  Associated symptoms include vaginal discharge.  Nothing makes it better and nothing makes it worse.  Pt denies fever, chills, headache, chest pain, SOB, N/V/D, constipation, vaginal bleeding, weakness, dizziness, syncope.  Pt denies hx of STD.  LMP Apr 06, 2013.  Pt is not currently on any contraception.      Past Medical History  Diagnosis Date  . Peripheral neuropathy    History reviewed. No pertinent past surgical history. No family history on file. History  Substance Use Topics  . Smoking status: Never Smoker   . Smokeless tobacco: Never Used  . Alcohol Use: No   OB History   Grav Para Term Preterm Abortions TAB SAB Ect Mult Living                 Review of Systems  Constitutional: Negative for fever, diaphoresis, appetite change, fatigue and unexpected  weight change.  HENT: Negative for mouth sores and trouble swallowing.   Respiratory: Negative for cough, chest tightness, shortness of breath, wheezing and stridor.   Cardiovascular: Negative for chest pain and palpitations.  Gastrointestinal: Positive for abdominal pain (LLQ). Negative for nausea, vomiting, diarrhea, constipation, blood in stool, abdominal distention and rectal pain.  Genitourinary: Negative for dysuria, urgency, frequency, hematuria, flank pain and difficulty urinating.  Musculoskeletal: Negative for back pain, neck pain and neck stiffness.  Skin: Negative for rash.  Neurological: Negative for weakness.  Hematological: Negative for adenopathy.  Psychiatric/Behavioral: Negative for confusion.  All other systems reviewed and are negative.    Allergies  Review of patient's allergies indicates no known allergies.  Home Medications   Current Outpatient Rx  Name  Route  Sig  Dispense  Refill  . ibuprofen (ADVIL,MOTRIN) 800 MG tablet   Oral   Take 1 tablet (800 mg total) by mouth 3 (three) times daily.   21 tablet   0   . metroNIDAZOLE (FLAGYL) 500 MG tablet   Oral   Take 1 tablet (500 mg total) by mouth 2 (two) times daily. One po bid x 7 days   14 tablet   0    BP 99/67  Pulse 65  Temp(Src) 98.5 F (36.9 C) (Oral)  Resp 16  SpO2 100%  LMP 03/12/2013 Physical Exam  Nursing note and vitals reviewed. Constitutional: She is oriented to person, place, and time. She appears well-developed and well-nourished.  HENT:  Head: Normocephalic and atraumatic.  Mouth/Throat: Oropharynx  is clear and moist.  Eyes: Conjunctivae are normal. No scleral icterus.  Neck: Normal range of motion.  Cardiovascular: Normal rate, regular rhythm, normal heart sounds and intact distal pulses.   No murmur heard. Pulmonary/Chest: Effort normal and breath sounds normal. No respiratory distress. She has no wheezes.  Abdominal: Soft. Bowel sounds are normal. She exhibits no distension  and no mass. There is tenderness. There is guarding. There is no rebound. Hernia confirmed negative in the right inguinal area and confirmed negative in the left inguinal area.  Genitourinary: Pelvic exam was performed with patient supine. No labial fusion. There is no rash, tenderness, lesion or injury on the right labia. There is no rash, tenderness, lesion or injury on the left labia. Uterus is not deviated, not enlarged, not fixed and not tender. Cervix exhibits no motion tenderness, no discharge and no friability. Right adnexum displays no mass, no tenderness and no fullness. Left adnexum displays tenderness (very mild). Left adnexum displays no mass and no fullness. No erythema, tenderness or bleeding around the vagina. No foreign body around the vagina. No signs of injury around the vagina. Vaginal discharge (Scant, thick, white) found.  Mild left adnexal tenderness; no mass No cervical motion tenderness No strawberry cervix  Musculoskeletal: Normal range of motion. She exhibits no edema and no tenderness.  Lymphadenopathy:       Right: No inguinal adenopathy present.       Left: No inguinal adenopathy present.  Neurological: She is alert and oriented to person, place, and time. Coordination normal.  Skin: Skin is warm and dry. No erythema.  Psychiatric: She has a normal mood and affect. Her behavior is normal.    ED Course  Procedures (including critical care time) Labs Review Labs Reviewed  WET PREP, GENITAL - Abnormal; Notable for the following:    Clue Cells Wet Prep HPF POC FEW (*)    WBC, Wet Prep HPF POC RARE (*)    All other components within normal limits  URINALYSIS, ROUTINE W REFLEX MICROSCOPIC - Abnormal; Notable for the following:    APPearance CLOUDY (*)    All other components within normal limits  GC/CHLAMYDIA PROBE AMP  RPR  HIV ANTIBODY (ROUTINE TESTING)  POCT PREGNANCY, URINE   Imaging Review No results found.  EKG Interpretation   None       MDM    1. Possible exposure to STD      Kathleen Rice presents with vaginal discharge several days after unprotected sex.  Wet prep with a few clue cells and rare white blood cells. Noted to urinary tract infection. Patient without cervical motion tenderness, no concern for PID.  Mild left adnexal pain on exam, no masses; doubt torsion or tubo-ovarian abscess. Patient is afebrile, non-tachycardic, hemodynamically stable and nonseptic appearing. Will treat with azithromycin and Rocephin for prophylaxis. HIV, RPR and GC chlamydia pending.  Patient to be discharged with instructions to follow up with OBGYN. Discussed importance of using protection when sexually active. Pt understands that they have GC/Chlamydia cultures pending and that they will need to inform all sexual partners if results return positive.  Pt has also been treated with flagyl for Bacterial Vaginosis. Pt has been advised to not drink alcohol while on this medication.   It has been determined that no acute conditions requiring further emergency intervention are present at this time. The patient/guardian have been advised of the diagnosis and plan. We have discussed signs and symptoms that warrant return to the ED, such as  changes or worsening in symptoms.   Vital signs are stable at discharge.   BP 99/67  Pulse 65  Temp(Src) 98.5 F (36.9 C) (Oral)  Resp 16  SpO2 100%  LMP 03/12/2013  Patient/guardian has voiced understanding and agreed to follow-up with the PCP or specialist.       Dierdre Forth, PA-C 04/17/13 1816

## 2013-04-17 NOTE — ED Notes (Signed)
Pt c/o of having unprotected sex this past weekend and is concerned about STD exposure.

## 2013-04-17 NOTE — ED Provider Notes (Signed)
Medical screening examination/treatment/procedure(s) were performed by non-physician practitioner and as supervising physician I was immediately available for consultation/collaboration.  Mayme Profeta M Maurissa Ambrose, MD 04/17/13 1954 

## 2013-04-17 NOTE — Progress Notes (Signed)
Patient confirms that she does not have a pcp.  [facility] provided patient with a list of pcps who accept Medicaid insurance in [place].  Patient thankful for resources.

## 2013-04-18 LAB — RPR: RPR Ser Ql: NONREACTIVE

## 2013-04-18 LAB — HIV ANTIBODY (ROUTINE TESTING W REFLEX): HIV: NONREACTIVE

## 2013-04-18 LAB — GC/CHLAMYDIA PROBE AMP
CT Probe RNA: NEGATIVE
GC Probe RNA: NEGATIVE

## 2013-08-23 ENCOUNTER — Emergency Department (HOSPITAL_COMMUNITY)
Admission: EM | Admit: 2013-08-23 | Discharge: 2013-08-23 | Disposition: A | Discharge status: Home / Self Care | Payer: Medicaid Other | Attending: Emergency Medicine | Admitting: Emergency Medicine

## 2013-08-23 ENCOUNTER — Encounter (HOSPITAL_COMMUNITY): Payer: Self-pay | Admitting: Emergency Medicine

## 2013-08-23 DIAGNOSIS — Z8669 Personal history of other diseases of the nervous system and sense organs: Secondary | ICD-10-CM | POA: Insufficient documentation

## 2013-08-23 DIAGNOSIS — R062 Wheezing: Secondary | ICD-10-CM | POA: Insufficient documentation

## 2013-08-23 DIAGNOSIS — R21 Rash and other nonspecific skin eruption: Secondary | ICD-10-CM | POA: Insufficient documentation

## 2013-08-23 DIAGNOSIS — Z3490 Encounter for supervision of normal pregnancy, unspecified, unspecified trimester: Secondary | ICD-10-CM

## 2013-08-23 DIAGNOSIS — Z3201 Encounter for pregnancy test, result positive: Secondary | ICD-10-CM | POA: Insufficient documentation

## 2013-08-23 DIAGNOSIS — O9989 Other specified diseases and conditions complicating pregnancy, childbirth and the puerperium: Secondary | ICD-10-CM | POA: Insufficient documentation

## 2013-08-23 LAB — URINALYSIS, ROUTINE W REFLEX MICROSCOPIC
BILIRUBIN URINE: NEGATIVE
GLUCOSE, UA: NEGATIVE mg/dL
Hgb urine dipstick: NEGATIVE
KETONES UR: NEGATIVE mg/dL
Leukocytes, UA: NEGATIVE
Nitrite: NEGATIVE
Protein, ur: NEGATIVE mg/dL
Specific Gravity, Urine: 1.015 (ref 1.005–1.030)
Urobilinogen, UA: 1 mg/dL (ref 0.0–1.0)
pH: 6 (ref 5.0–8.0)

## 2013-08-23 LAB — POC URINE PREG, ED: Preg Test, Ur: POSITIVE — AB

## 2013-08-23 MED ORDER — PRENATAL COMPLETE 14-0.4 MG PO TABS: 1.0000 | ORAL_TABLET | Freq: Every day | ORAL | Status: DC

## 2013-08-23 NOTE — ED Notes (Addendum)
Pt reports taking several pregnancy test today with positive results and presents to confirm pregnancy and be evaluated as she is unsure how far along she is. Pt currently denies abdominal or pelvic pain, nausea, and emesis. Pt denies vaginal bleeding or discharge. Pt reports her last period as July 18, 2013. Pt is A/O x4, in NAD, and vitals are WDL.

## 2013-08-23 NOTE — ED Provider Notes (Signed)
Medical screening examination/treatment/procedure(s) were performed by non-physician practitioner and as supervising physician I was immediately available for consultation/collaboration.   EKG Interpretation None      Karstyn Birkey, MD, FACEP   Denisa Enterline L Stephens Shreve, MD 08/23/13 2125 

## 2013-08-23 NOTE — ED Provider Notes (Signed)
CSN: [id number]     Arrival date & time 08/23/13  1957 History  This chart was scribed for non-physician practitioner, Ivonne AndrewPeter Banessa Mao, PA-C,working with Ward GivensIva L Knapp, MD, by Karle PlumberJennifer Tensley, ED Scribe.  This patient was seen in room WTR5/WTR5 and the patient's care was started at 8:39 PM.  Chief Complaint  Patient presents with  . Possible Pregnancy   The history is provided by the patient. No language interpreter was used.   HPI Comments:  Kathleen Rice is a 23 y.o. female who presents to the Emergency Department complaining of a late period. She states her LNMP was 07/18/13. She states she has taken "a couple" positive home pregnancy tests. She reports associated hot flashes and cramping this past weak as if her menstrual cycle was about to begin. She denies abdominal pain, urinary symptoms, vaginal discharge, or vaginal bleeding. She denies h/o asthma. She reports smoking occasionally.  She also reports an unrelated rash on the base of her neck that started approximately one week ago. She states she went on a cruise and upon returning one week ago noticed the itchy rash. She states she bought a new shirt and wore it before she washed it. She denies wearing any new jewelry, using any new soaps, detergents, or creams. She denies pain or swelling of the area.   Past Medical History  Diagnosis Date  . Peripheral neuropathy    No past surgical history on file. No family history on file. History  Substance Use Topics  . Smoking status: Never Smoker   . Smokeless tobacco: Never Used  . Alcohol Use: No   OB History   Grav Para Term Preterm Abortions TAB SAB Ect Mult Living                 Review of Systems  Gastrointestinal: Negative for nausea, vomiting and abdominal pain.  Genitourinary: Positive for menstrual problem (missed period). Negative for dysuria, vaginal bleeding, vaginal discharge and pelvic pain.  All other systems reviewed and are negative.   Allergies  Review of  patient's allergies indicates no known allergies.  Home Medications  No current outpatient prescriptions on file. Triage Vitals: BP 123/54  Pulse 70  Temp(Src) 98.3 F (36.8 C) (Oral)  Resp 18  SpO2 100%  LMP 07/18/2013 Physical Exam  Nursing note and vitals reviewed. Constitutional: She is oriented to person, place, and time. She appears well-developed and well-nourished.  HENT:  Head: Normocephalic and atraumatic.  Eyes: EOM are normal.  Neck: Normal range of motion.  Cardiovascular: Normal rate.   Pulmonary/Chest: Effort normal. No respiratory distress. She has wheezes (mild, course wheezing).  Abdominal: Soft. She exhibits no distension and no mass. There is no tenderness. There is no rebound and no guarding.  Musculoskeletal: Normal range of motion.  Neurological: She is alert and oriented to person, place, and time.  Skin: Skin is warm and dry. Rash noted.  Localized papillar rash at base of neck. Few spots that have been scratched over and have broken open.  Psychiatric: She has a normal mood and affect. Her behavior is normal.    ED Course  Procedures DIAGNOSTIC STUDIES: Oxygen Saturation is 100% on RA, normal by my interpretation.   COORDINATION OF CARE: 8:46 PM- Informed pt of positive pregnancy test. Advised pt to follow up with OB/GYN. Advised pt to take Benadryl for rash. Pt verbalizes understanding and agrees to plan.  Results for orders placed during the hospital encounter of 08/23/13  URINALYSIS, ROUTINE W REFLEX MICROSCOPIC  Result Value Ref Range   Color, Urine YELLOW  YELLOW   APPearance CLEAR  CLEAR   Specific Gravity, Urine 1.015  1.005 - 1.030   pH 6.0  5.0 - 8.0   Glucose, UA NEGATIVE  NEGATIVE mg/dL   Hgb urine dipstick NEGATIVE  NEGATIVE   Bilirubin Urine NEGATIVE  NEGATIVE   Ketones, ur NEGATIVE  NEGATIVE mg/dL   Protein, ur NEGATIVE  NEGATIVE mg/dL   Urobilinogen, UA 1.0  0.0 - 1.0 mg/dL   Nitrite NEGATIVE  NEGATIVE   Leukocytes, UA  NEGATIVE  NEGATIVE  POC URINE PREG, ED      Result Value Ref Range   Preg Test, Ur POSITIVE (*) NEGATIVE     MDM   Final diagnoses:  Pregnancy     I personally performed the services described in this documentation, which was scribed in my presence. The recorded information has been reviewed and is accurate.    Angus Seller, PA-C 08/23/13 [phone]

## 2013-08-23 NOTE — Discharge Instructions (Signed)
Pregnancy test was positive. Please followup with a primary care provider or her OB/GYN specialist for continued evaluation and treatments during her pregnancy.    Pregnancy, First Trimester The first trimester is the first 3 months your baby is growing inside you. It is important to follow your doctor's instructions. HOME CARE   Do not smoke.  Do not drink alcohol.  Only take medicine as told by your doctor.  Exercise.  Eat healthy foods. Eat regular, well-balanced meals.  You can have sex (intercourse) if there are no other problems with the pregnancy.  Things that help with morning sickness:  Eat soda crackers before getting up in the morning.  Eat 4 to 5 small meals rather than 3 large meals.  Drink liquids between meals, not during meals.  Go to all appointments as told.  Take all vitamins or supplements as told by your doctor. GET HELP RIGHT AWAY IF:   You develop a fever.  You have a bad smelling fluid that is leaking from your vagina.  There is bleeding from the vagina.  You develop severe belly (abdominal) or back pain.  You throw up (vomit) blood. It may look like coffee grounds.  You lose more than 2 pounds in a week.  You gain 5 pounds or more in a week.  You gain more than 2 pounds in a week and you see puffiness (swelling) in your feet, ankles, or legs.  You have severe dizziness or pass out (faint).  You are around people who have [place] measles, chickenpox, or fifth disease.  You have a headache, watery poop (diarrhea), pain with peeing (urinating), or cannot breath right. Document Released: 11/17/2007 Document Revised: 08/23/2011 Document Reviewed: 11/17/2007 [facility] Patient Information 2014 [place], [place].

## 2013-09-28 ENCOUNTER — Encounter: Payer: Medicaid Other | Admitting: Obstetrics and Gynecology

## 2013-11-07 ENCOUNTER — Emergency Department (HOSPITAL_COMMUNITY)
Admission: EM | Admit: 2013-11-07 | Discharge: 2013-11-07 | Disposition: A | Discharge status: Home / Self Care | Payer: Self-pay | Attending: Emergency Medicine | Admitting: Emergency Medicine

## 2013-11-07 ENCOUNTER — Encounter (HOSPITAL_COMMUNITY): Payer: Self-pay | Admitting: Emergency Medicine

## 2013-11-07 ENCOUNTER — Emergency Department (HOSPITAL_COMMUNITY): Payer: Self-pay

## 2013-11-07 DIAGNOSIS — Z8669 Personal history of other diseases of the nervous system and sense organs: Secondary | ICD-10-CM | POA: Insufficient documentation

## 2013-11-07 DIAGNOSIS — Y9389 Activity, other specified: Secondary | ICD-10-CM | POA: Insufficient documentation

## 2013-11-07 DIAGNOSIS — F411 Generalized anxiety disorder: Secondary | ICD-10-CM | POA: Insufficient documentation

## 2013-11-07 DIAGNOSIS — S161XXA Strain of muscle, fascia and tendon at neck level, initial encounter: Secondary | ICD-10-CM

## 2013-11-07 DIAGNOSIS — S46909A Unspecified injury of unspecified muscle, fascia and tendon at shoulder and upper arm level, unspecified arm, initial encounter: Secondary | ICD-10-CM | POA: Insufficient documentation

## 2013-11-07 DIAGNOSIS — Z79899 Other long term (current) drug therapy: Secondary | ICD-10-CM | POA: Insufficient documentation

## 2013-11-07 DIAGNOSIS — S139XXA Sprain of joints and ligaments of unspecified parts of neck, initial encounter: Secondary | ICD-10-CM | POA: Insufficient documentation

## 2013-11-07 DIAGNOSIS — M25511 Pain in right shoulder: Secondary | ICD-10-CM

## 2013-11-07 DIAGNOSIS — S4980XA Other specified injuries of shoulder and upper arm, unspecified arm, initial encounter: Secondary | ICD-10-CM | POA: Insufficient documentation

## 2013-11-07 DIAGNOSIS — IMO0002 Reserved for concepts with insufficient information to code with codable children: Secondary | ICD-10-CM | POA: Insufficient documentation

## 2013-11-07 DIAGNOSIS — Y92410 Unspecified street and highway as the place of occurrence of the external cause: Secondary | ICD-10-CM | POA: Insufficient documentation

## 2013-11-07 MED ORDER — DIAZEPAM 5 MG PO TABS
5.0000 mg | ORAL_TABLET | Freq: Once | ORAL | Status: AC
  Administered 2013-11-07: 5 mg via ORAL
  Filled 2013-11-07: qty 1

## 2013-11-07 MED ORDER — OXYCODONE-ACETAMINOPHEN 5-325 MG PO TABS: 1.0000 | ORAL_TABLET | Freq: Four times a day (QID) | ORAL | Status: DC | PRN

## 2013-11-07 MED ORDER — KETOROLAC TROMETHAMINE 30 MG/ML IJ SOLN
30.0000 mg | Freq: Once | INTRAMUSCULAR | Status: AC
Start: 2013-11-07 — End: 2013-11-07
  Administered 2013-11-07: 30 mg via INTRAMUSCULAR
  Filled 2013-11-07: qty 1

## 2013-11-07 MED ORDER — OXYCODONE-ACETAMINOPHEN 5-325 MG PO TABS
1.0000 | ORAL_TABLET | Freq: Once | ORAL | Status: AC
  Administered 2013-11-07: 1 via ORAL
  Filled 2013-11-07: qty 1

## 2013-11-07 MED ORDER — IBUPROFEN 600 MG PO TABS: 600.0000 mg | ORAL_TABLET | Freq: Four times a day (QID) | ORAL | Status: DC | PRN
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
## 2013-11-07 NOTE — Discharge Instructions (Signed)
Cervical Sprain °A cervical sprain is an injury in the neck in which the strong, fibrous tissues (ligaments) that connect your neck bones stretch or tear. Cervical sprains can range from mild to severe. Severe cervical sprains can cause the neck vertebrae to be unstable. This can lead to damage of the spinal cord and can result in serious nervous system problems. The amount of time it takes for a cervical sprain to get better depends on the cause and extent of the injury. Most cervical sprains heal in 1 to 3 weeks. °CAUSES  °Severe cervical sprains may be caused by:  °· Contact sport injuries (such as from football, rugby, wrestling, hockey, auto racing, gymnastics, diving, martial arts, or boxing).   °· Motor vehicle collisions.   °· Whiplash injuries. This is an injury from a sudden forward-and backward whipping movement of the head and neck.  °· Falls.   °Mild cervical sprains may be caused by:  °· Being in an awkward position, such as while cradling a telephone between your ear and shoulder.   °· Sitting in a chair that does not offer proper support.   °· Working at a poorly designed computer station.   °· Looking up or down for long periods of time.   °SYMPTOMS  °· Pain, soreness, stiffness, or a burning sensation in the front, back, or sides of the neck. This discomfort may develop immediately after the injury or slowly, 24 hours or more after the injury.   °· Pain or tenderness directly in the middle of the back of the neck.   °· Shoulder or upper back pain.   °· Limited ability to move the neck.   °· Headache.   °· Dizziness.   °· Weakness, numbness, or tingling in the hands or arms.   °· Muscle spasms.   °· Difficulty swallowing or chewing.   °· Tenderness and swelling of the neck.   °DIAGNOSIS  °Most of the time your health care provider can diagnose a cervical sprain by taking your history and doing a physical exam. Your health care provider will ask about previous neck injuries and any known neck  problems, such as arthritis in the neck. X-rays may be taken to find out if there are any other problems, such as with the bones of the neck. Other tests, such as a CT scan or MRI, may also be needed.  °TREATMENT  °Treatment depends on the severity of the cervical sprain. Mild sprains can be treated with rest, keeping the neck in place (immobilization), and pain medicines. Severe cervical sprains are immediately immobilized. Further treatment is done to help with pain, muscle spasms, and other symptoms and may include: °· Medicines, such as pain relievers, numbing medicines, or muscle relaxants.   °· Physical therapy. This may involve stretching exercises, strengthening exercises, and posture training. Exercises and improved posture can help stabilize the neck, strengthen muscles, and help stop symptoms from returning.   °HOME CARE INSTRUCTIONS  °· Put ice on the injured area.   °· Put ice in a plastic bag.   °· Place a towel between your skin and the bag.   °· Leave the ice on for 15 20 minutes, 3 4 times a day.   °· If your injury was severe, you may have been given a cervical collar to wear. A cervical collar is a two-piece collar designed to keep your neck from moving while it heals. °· Do not remove the collar unless instructed by your health care provider. °· If you have long hair, keep it outside of the collar. °· Ask your health care provider before making any adjustments to your collar.   Minor adjustments may be required over time to improve comfort and reduce pressure on your chin or on the back of your head. °· If you are allowed to remove the collar for cleaning or bathing, follow your health care provider's instructions on how to do so safely. °· Keep your collar clean by wiping it with mild soap and water and drying it completely. If the collar you have been given includes removable pads, remove them every 1 2 days and hand wash them with soap and water. Allow them to air dry. They should be completely  dry before you wear them in the collar. °· If you are allowed to remove the collar for cleaning and bathing, wash and dry the skin of your neck. Check your skin for irritation or sores. If you see any, tell your health care provider. °· Do not drive while wearing the collar.   °· Only take over-the-counter or prescription medicines for pain, discomfort, or fever as directed by your health care provider.   °· Keep all follow-up appointments as directed by your health care provider.   °· Keep all physical therapy appointments as directed by your health care provider.   °· Make any needed adjustments to your workstation to promote good posture.   °· Avoid positions and activities that make your symptoms worse.   °· Warm up and stretch before being active to help prevent problems.   °SEEK MEDICAL CARE IF:  °· Your pain is not controlled with medicine.   °· You are unable to decrease your pain medicine over time as planned.   °· Your activity level is not improving as expected.   °SEEK IMMEDIATE MEDICAL CARE IF:  °· You develop any bleeding. °· You develop stomach upset. °· You have signs of an allergic reaction to your medicine.   °· Your symptoms get worse.   °· You develop new, unexplained symptoms.   °· You have numbness, tingling, weakness, or paralysis in any part of your body.   °MAKE SURE YOU:  °· Understand these instructions. °· Will watch your condition. °· Will get help right away if you are not doing well or get worse. °Document Released: 03/28/2007 Document Revised: 03/21/2013 Document Reviewed: 12/06/2012 °ExitCare® Patient Information ©2014 [employer]. ° °Motor Vehicle Collision  °It is common to have multiple bruises and sore muscles after a motor vehicle collision (MVC). These tend to feel worse for the first 24 hours. You may have the most stiffness and soreness over the first several hours. You may also feel worse when you wake up the first morning after your collision. After this point, you will  usually begin to improve with each day. The speed of improvement often depends on the severity of the collision, the number of injuries, and the location and nature of these injuries. °HOME CARE INSTRUCTIONS  °· Put ice on the injured area. °· Put ice in a plastic bag. °· Place a towel between your skin and the bag. °· Leave the ice on for 15-20 minutes, 03-04 times a day. °· Drink enough fluids to keep your urine clear or pale yellow. Do not drink alcohol. °· Take a warm shower or bath once or twice a day. This will increase blood flow to sore muscles. °· You may return to activities as directed by your caregiver. Be careful when lifting, as this may aggravate neck or back pain. °· Only take over-the-counter or prescription medicines for pain, discomfort, or fever as directed by your caregiver. Do not use aspirin. This may increase bruising and bleeding. °SEEK IMMEDIATE MEDICAL CARE IF: °·   You have numbness, tingling, or weakness in the arms or legs. °· You develop severe headaches not relieved with medicine. °· You have severe neck pain, especially tenderness in the middle of the back of your neck. °· You have changes in bowel or bladder control. °· There is increasing pain in any area of the body. °· You have shortness of breath, lightheadedness, dizziness, or fainting. °· You have chest pain. °· You feel sick to your stomach (nauseous), throw up (vomit), or sweat. °· You have increasing abdominal discomfort. °· There is blood in your urine, stool, or vomit. °· You have pain in your shoulder (shoulder strap areas). °· You feel your symptoms are getting worse. °MAKE SURE YOU:  °· Understand these instructions. °· Will watch your condition. °· Will get help right away if you are not doing well or get worse. °Document Released: 05/31/2005 Document Revised: 08/23/2011 Document Reviewed: 10/28/2010 °ExitCare® Patient Information ©2014 [employer] LLC. ° °
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
## 2013-11-07 NOTE — ED Notes (Signed)
MD at bedside. EDP Horton present to evaluate this pt 

## 2013-11-07 NOTE — ED Notes (Signed)
Patient back from xray Patient states that previously given medication did not help her pain Dr. Wilkie Aye made aware and order placed  Patient medicated, see MAR Patient offered food and drink, per MD order---patient declined Will make MD aware
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
## 2013-11-13 ENCOUNTER — Emergency Department (HOSPITAL_COMMUNITY): Payer: Self-pay

## 2013-11-13 ENCOUNTER — Encounter (HOSPITAL_COMMUNITY): Payer: Self-pay | Admitting: Emergency Medicine

## 2013-11-13 ENCOUNTER — Emergency Department (HOSPITAL_COMMUNITY)
Admission: EM | Admit: 2013-11-13 | Discharge: 2013-11-13 | Disposition: A | Discharge status: Home / Self Care | Payer: Self-pay | Attending: Emergency Medicine | Admitting: Emergency Medicine

## 2013-11-13 DIAGNOSIS — Z79899 Other long term (current) drug therapy: Secondary | ICD-10-CM | POA: Insufficient documentation

## 2013-11-13 DIAGNOSIS — S139XXA Sprain of joints and ligaments of unspecified parts of neck, initial encounter: Secondary | ICD-10-CM | POA: Insufficient documentation

## 2013-11-13 DIAGNOSIS — Y92410 Unspecified street and highway as the place of occurrence of the external cause: Secondary | ICD-10-CM | POA: Insufficient documentation

## 2013-11-13 DIAGNOSIS — S161XXA Strain of muscle, fascia and tendon at neck level, initial encounter: Secondary | ICD-10-CM

## 2013-11-13 DIAGNOSIS — Y9389 Activity, other specified: Secondary | ICD-10-CM | POA: Insufficient documentation

## 2013-11-13 DIAGNOSIS — Z8669 Personal history of other diseases of the nervous system and sense organs: Secondary | ICD-10-CM | POA: Insufficient documentation

## 2013-11-13 MED ORDER — KETOROLAC TROMETHAMINE 60 MG/2ML IM SOLN
60.0000 mg | Freq: Once | INTRAMUSCULAR | Status: AC
  Administered 2013-11-13: 60 mg via INTRAMUSCULAR
  Filled 2013-11-13: qty 2

## 2013-11-13 MED ORDER — NAPROXEN 500 MG PO TABS: 500.0000 mg | ORAL_TABLET | Freq: Two times a day (BID) | ORAL | Status: DC

## 2013-11-13 MED ORDER — METHOCARBAMOL 500 MG PO TABS
500.0000 mg | ORAL_TABLET | Freq: Once | ORAL | Status: AC
  Administered 2013-11-13: 500 mg via ORAL
  Filled 2013-11-13: qty 1

## 2013-11-13 MED ORDER — METHOCARBAMOL 500 MG PO TABS: 500.0000 mg | ORAL_TABLET | Freq: Two times a day (BID) | ORAL | Status: DC | PRN

## 2013-11-13 NOTE — ED Notes (Signed)
Patient transported to X-ray 

## 2013-11-13 NOTE — Discharge Instructions (Signed)
°Emergency Department Resource Guide °1) Find a Doctor and Pay Out of Pocket °Although you won't have to find out who is covered by your insurance plan, it is a good idea to ask around and get recommendations. You will then need to call the office and see if the doctor you have chosen will accept you as a new patient and what types of options they offer for patients who are self-pay. Some doctors offer discounts or will set up payment plans for their patients who do not have insurance, but you will need to ask so you aren't surprised when you get to your appointment. ° °2) Contact Your Local Health Department °Not all health departments have doctors that can see patients for sick visits, but many do, so it is worth a call to see if yours does. If you don't know where your local health department is, you can check in your phone book. The CDC also has a tool to help you locate your state's health department, and many state websites also have listings of all of their local health departments. ° °3) Find a Walk-in Clinic °If your illness is not likely to be very severe or complicated, you may want to try a walk in clinic. These are popping up all over the country in pharmacies, drugstores, and shopping centers. They're usually staffed by nurse practitioners or physician assistants that have been trained to treat common illnesses and complaints. They're usually fairly quick and inexpensive. However, if you have serious medical issues or chronic medical problems, these are probably not your best option. ° °No Primary Care Doctor: °- Call [facility] at  832-8000 - they can help you locate a primary care doctor that  accepts your insurance, provides certain services, etc. °- Physician Referral Service- 1-800-533-3463 ° °Chronic Pain Problems: °Organization         Address  Phone   Notes  °[facility]  (336) 297-2271 Patients need to be referred by their primary care doctor.  ° °Medication  Assistance: °Organization         Address  Phone   Notes  °[place] [facility] [address] °[place], [place] [address] (336) 641-8030 --Must be a resident of [place] °-- Must have NO insurance coverage whatsoever (no Medicaid/ Medicare, etc.) °-- The pt. MUST have a primary care doctor that directs their care regularly and follows them in the community °  °[facility]  (866) 331-1348   °[employer]  (888) 892-1162   ° °Agencies that provide inexpensive medical care: °Organization         Address  Phone   Notes  °[facility]  (336) 832-8035   °[facility]    (336) 832-7272   °[facility] [address] °[place], [place] [address] (336) 832-4777   °[facility] [address], °[place] (336) 271-4999   °[facility]    (336) 373-0678   °[facility]    (336) 272-1050   °[facility] ° [address], [place] Phone:  (336) 832-4444, Fax:  (336) 832-4440 Hours of Operation:  9 am - 6 pm, M-F.  Also accepts Medicaid/Medicare and self-pay.  °[facility] ° [address], [place] Phone: (336) 832-3150, Fax: (336) 832-3151. Hours of Operation:  8:30 am - 5:30 pm, M-F.  Also accepts Medicaid and self-pay.  °[facility] [place] [address], [place] Phone: (336) 878-6027   °[facility] [address], [place], [place] (336)723-1848, Ext. 123 Mondays & Thursdays: 7-9 AM.  First 15 patients are seen on a first come, first serve basis. °  ° °Medicaid-accepting [place] Providers: ° °Organization         Address  Phone   Notes  °[facility] [address], [place] (336) 641-2100 Also accepts self-pay patients.  °[facility] [address], [place] ° (336) 856-9996   °[facility] [address], [place]  (336) 288-8857   °[facility] [address] [place] [address], [place] (336) 299-7000   °Veita Bland [address], [place]  ° (336) 373-1557 Only accepts [place] Access Medicaid patients after they have their name applied to their card.  ° °Self-Pay (no insurance) in [place]: ° °Organization         Address  Phone   Notes  °Sickle Cell Patients, [facility] [address], [place] (336) 832-1970   °[facility] [address], [place] (336) 832-4400   °[facility] ° [address], [place] (336) 992-4800   °[facility]/Dr. Osei-Bonsu ° [address] [place] [address], [place] or [address], [place] (336) 841-8500 Phone number for both [place] and [place] locations is the same.  °[facility] [address], [place] (336) 299-0000   °[facility] [address] [place] [address], [place] or [address] (336) 852-7530 °(336) 878-2260   °[facility] [address],  (336) 350-1642, phone; (336) 294-5005, fax Sees patients 1st and 3rd Saturday of every month.  Must not qualify for public or private insurance (i.e. Medicaid, Medicare, [place] Health Choice, Veterans' Benefits) • Household income should be no more than 200% of the poverty level •The clinic cannot treat you if you are pregnant or think you are pregnant • Sexually transmitted diseases are not treated at the clinic.  ° ° °Dental Care: °Organization         Address  Phone  Notes  °[place] [facility] [address],  (336) 641-6152 Accepts children up to age 21 who are enrolled in Medicaid or [place] Health Choice; pregnant women with a Medicaid card; and children who have applied for Medicaid or [place] Health Choice, but were declined, whose parents can pay a reduced fee at time of service.  °[place]  [facility] [place]  [address], [place] (336) 641-7733 Accepts children up to age 21 who are enrolled in Medicaid or [place] Health Choice; pregnant women with a Medicaid card; and children who have applied for Medicaid or  Health Choice, but were declined, whose parents can pay a reduced fee at time of service.  °[facility] ° [address],  (336) 641-4533 Patients are seen by appointment only. Walk-ins are not accepted. [facility] will see patients 18 years of age and older. °Monday - Tuesday (8am-5pm) °Most Wednesdays (8:30-5pm) °$30 per visit, cash only  °[facility] ° [address], [place] (336) 641-4533 Patients are seen by appointment only. Walk-ins are not accepted. [facility] will see patients 18 years of age and older. °One   Wednesday Evening (Monthly: Volunteer Based).  $30 per visit, cash only  °[facility]  (919) 537-3737 for adults; Children under age 4, call Graduate Pediatric Dentistry at (919) [phone]. Children aged 4-14, please call (919) [phone] to request a pediatric application. ° Dental services are provided in all areas of dental care including fillings, crowns and bridges, complete and partial dentures, implants, gum treatment, root canals, and extractions. Preventive care is also provided. Treatment is provided to both adults and children. °Patients are selected via a lottery and there is often a waiting list. °  °[facility] [address], °[place] ° (336) 763-8833 [email or web address] °  °[facility] [address], [place], [place] (336)723-1848, Ext. 123 Second and Fourth Thursday of each month, opens at 6:30 AM; Clinic ends at 9 AM.  Patients are seen on a first-come first-served basis, and a limited number are seen during each clinic.  ° °[facility] ° [address], [place], [place] (336) 723-7904    Eligibility Requirements °You must have lived in [place], [place], or [place] counties for at least the last three months. °  You cannot be eligible for state or federal sponsored healthcare insurance, including Veterans Administration, Medicaid, or Medicare. °  You generally cannot be eligible for healthcare insurance through your employer.  °  How to apply: °Eligibility screenings are held every Tuesday and Wednesday afternoon from 1:00 pm until 4:00 pm. You do not need an appointment for the interview!  °[facility] [address], [place], [place] 336-631-2330   °[facility]  336-342-8273   °[facility]  336-703-3100   °[facility]  336-570-6415   ° °Behavioral Health Resources in the Community: °Intensive Outpatient Programs °Organization         Address  Phone  Notes  °[facility] [address], [place], [place] 336-878-6098   °[facility] [address], [place], [place] 336-832-9800   °[facility] [address], [place], [place] ° 336-882-2125   °[facility] [address],  °[place], [place] 1-800-853-5163 or 336-641-4981   °Substance Abuse Resources °Organization         Address  Phone  Notes  °[facility]  336-882-2125   °[facility]  336-784-9470   °[facility]  336-285-9073   °[facility]  336-845-3988   °Residential & Outpatient Substance Abuse Program  1-800-659-3381   °Psychological Services °Organization         Address  Phone  Notes  °[facility]  336- 832-9600   °[facility]  336- 378-7881   °[facility] [address], [place] 1-800-853-5163 or 336-641-4981   ° °Mobile Crisis Teams °Organization         Address  Phone  Notes  °[facility], Mobile Crisis Care Unit  1-877-626-1772   °Assertive °[facility] ° [address]  [place], [place] 336-834-9664   °Sharon DeEsch [address] °[place] 336-554-5454   ° °Self-Help/Support Groups °Organization         Address  Phone             Notes  °[facility] - variety of support groups  336- 373-1402 Call for more information  °Narcotics Anonymous (NA), [facility] [address], °[place]   2 meetings at this location  ° °  Residential Treatment Programs °Organization         Address  Phone  Notes  °[facility] [address],    °[place]  1-866-801-8205   °[facility] ° [address], [place], [place] 704-293-8524   °[facility] [address], [place] 336-845-3988 Admissions: 8am-3pm M-F  °[facility] [address]    °[place], [place] 336-841-1104   °[facility] [address], [place], [place] 336-379-7146   °[facility] [address]  °[place], [place] 336-285-9073   °[facility] Intensive Outpatient [address], [place], [place] 336-852-3033   °[facility] [address]  °[place], [place] 1-877-615-2722 or 336-784-9470   °[facility] [address] [place], [place] 336-227-7417 Accepts Medicaid  °[facility] [address]  °[place] 1-800-659-3381 Substance Abuse/Addiction Treatment  ° °[place] Behavioral Health Resources °Organization         Address  Phone  Notes  °[facility]  (888) 581-9988   °Julie Brannon, PhD [address] [place], [place]   (336) 349-5553 or (336) 951-0000   °[facility]   [address] °[place], [place] (336) 349-4454   °[facility] [address], [place], [place] (336) 342-8316 Insurance/Medicaid/sponsorship through [facility]  °[facility] [address]                                    [place], [place] (336) 342-8316 Therapy/tele-psych/case    °[facility] [address]  ° [place], [place] (336) 349-2233    °Dr. Arfeen  (336) 349-4544   °[facility] [place]  [employer] [place] [facility] 1) [address], [place] °2) [address], [place] °3)  [address], [place] (336) 349-3220 °(336) 342-7768 ° °(336) 342-8140   °[place] [facility] (336) 342-1394 or (336) 342-3537 (After Hours)    ° ° °
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
## 2013-11-13 NOTE — ED Notes (Signed)
Pt discharged home with all belongings, pt alert, oriented and ambulatory upon discharge, 2 new RX prescribed, pt verbalizes understanding of discharge instructions, pt driven home by friend at bedside

## 2014-05-22 ENCOUNTER — Encounter (HOSPITAL_COMMUNITY): Payer: Self-pay | Admitting: *Deleted

## 2014-05-22 ENCOUNTER — Emergency Department (HOSPITAL_COMMUNITY)
Admission: EM | Admit: 2014-05-22 | Discharge: 2014-05-22 | Disposition: A | Discharge status: Home / Self Care | Payer: No Typology Code available for payment source | Attending: Emergency Medicine | Admitting: Emergency Medicine

## 2014-05-22 DIAGNOSIS — Y92410 Unspecified street and highway as the place of occurrence of the external cause: Secondary | ICD-10-CM | POA: Insufficient documentation

## 2014-05-22 DIAGNOSIS — Z8669 Personal history of other diseases of the nervous system and sense organs: Secondary | ICD-10-CM | POA: Insufficient documentation

## 2014-05-22 DIAGNOSIS — Z79899 Other long term (current) drug therapy: Secondary | ICD-10-CM | POA: Insufficient documentation

## 2014-05-22 DIAGNOSIS — S3992XA Unspecified injury of lower back, initial encounter: Secondary | ICD-10-CM | POA: Insufficient documentation

## 2014-05-22 DIAGNOSIS — Z791 Long term (current) use of non-steroidal anti-inflammatories (NSAID): Secondary | ICD-10-CM | POA: Insufficient documentation

## 2014-05-22 DIAGNOSIS — Y9389 Activity, other specified: Secondary | ICD-10-CM | POA: Insufficient documentation

## 2014-05-22 DIAGNOSIS — S199XXA Unspecified injury of neck, initial encounter: Secondary | ICD-10-CM | POA: Insufficient documentation

## 2014-05-22 DIAGNOSIS — Y998 Other external cause status: Secondary | ICD-10-CM | POA: Insufficient documentation

## 2014-05-22 MED ORDER — HYDROCODONE-ACETAMINOPHEN 5-325 MG PO TABS
2.0000 | ORAL_TABLET | Freq: Once | ORAL | Status: AC
  Administered 2014-05-22: 2 via ORAL
  Filled 2014-05-22: qty 2

## 2014-05-22 MED ORDER — HYDROCODONE-ACETAMINOPHEN 5-325 MG PO TABS
1.0000 | ORAL_TABLET | Freq: Four times a day (QID) | ORAL | Status: DC | PRN
Start: 2014-05-22 — End: 2015-07-11

## 2014-05-22 NOTE — Discharge Instructions (Signed)
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
## 2014-05-22 NOTE — ED Notes (Signed)
pts vital signs updated pt awaiting discharge paperwork at bedside.  
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
## 2014-07-31 ENCOUNTER — Encounter (HOSPITAL_COMMUNITY): Payer: Self-pay | Admitting: *Deleted

## 2014-07-31 ENCOUNTER — Emergency Department (HOSPITAL_COMMUNITY)
Admission: EM | Admit: 2014-07-31 | Discharge: 2014-07-31 | Disposition: A | Discharge status: Home / Self Care | Payer: Medicaid Other | Attending: Emergency Medicine | Admitting: Emergency Medicine

## 2014-07-31 DIAGNOSIS — Z791 Long term (current) use of non-steroidal anti-inflammatories (NSAID): Secondary | ICD-10-CM | POA: Insufficient documentation

## 2014-07-31 DIAGNOSIS — R112 Nausea with vomiting, unspecified: Secondary | ICD-10-CM | POA: Insufficient documentation

## 2014-07-31 DIAGNOSIS — Z3202 Encounter for pregnancy test, result negative: Secondary | ICD-10-CM | POA: Insufficient documentation

## 2014-07-31 DIAGNOSIS — Z8669 Personal history of other diseases of the nervous system and sense organs: Secondary | ICD-10-CM | POA: Insufficient documentation

## 2014-07-31 DIAGNOSIS — Z79899 Other long term (current) drug therapy: Secondary | ICD-10-CM | POA: Insufficient documentation

## 2014-07-31 LAB — BASIC METABOLIC PANEL
ANION GAP: 4 — AB (ref 5–15)
BUN: 14 mg/dL (ref 6–23)
CALCIUM: 9.4 mg/dL (ref 8.4–10.5)
CO2: 29 mmol/L (ref 19–32)
Chloride: 105 mmol/L (ref 96–112)
Creatinine, Ser: 0.83 mg/dL (ref 0.50–1.10)
GFR calc Af Amer: >90 mL/min (ref >90)
Glucose, Bld: 81 mg/dL (ref 70–99)
Potassium: 4 mmol/L (ref 3.5–5.1)
Sodium: 138 mmol/L (ref 135–145)

## 2014-07-31 LAB — CBC WITH DIFFERENTIAL/PLATELET
Basophils Absolute: 0 10*3/uL (ref 0.0–0.1)
Basophils Relative: 0 % (ref 0–1)
EOS ABS: 0.2 10*3/uL (ref 0.0–0.7)
Eosinophils Relative: 4 % (ref 0–5)
HCT: 40 % (ref 36.0–46.0)
HEMOGLOBIN: 13.5 g/dL (ref 12.0–15.0)
LYMPHS ABS: 1.7 10*3/uL (ref 0.7–4.0)
Lymphocytes Relative: 30 % (ref 12–46)
MCH: 27.3 pg (ref 26.0–34.0)
MCHC: 33.8 g/dL (ref 30.0–36.0)
MCV: 80.8 fL (ref 78.0–100.0)
MONOS PCT: 15 % — AB (ref 3–12)
Monocytes Absolute: 0.8 10*3/uL (ref 0.1–1.0)
NEUTROS ABS: 2.9 10*3/uL (ref 1.7–7.7)
Neutrophils Relative %: 51 % (ref 43–77)
Platelets: 324 10*3/uL (ref 150–400)
RBC: 4.95 MIL/uL (ref 3.87–5.11)
RDW: 12.9 % (ref 11.5–15.5)
WBC: 5.6 10*3/uL (ref 4.0–10.5)

## 2014-07-31 LAB — URINALYSIS, ROUTINE W REFLEX MICROSCOPIC
GLUCOSE, UA: NEGATIVE mg/dL
HGB URINE DIPSTICK: NEGATIVE
KETONES UR: 15 mg/dL — AB
Leukocytes, UA: NEGATIVE
Nitrite: NEGATIVE
Protein, ur: NEGATIVE mg/dL
Specific Gravity, Urine: 1.034 — ABNORMAL HIGH (ref 1.005–1.030)
UROBILINOGEN UA: 1 mg/dL (ref 0.0–1.0)
pH: 6 (ref 5.0–8.0)

## 2014-07-31 LAB — PREGNANCY, URINE: Preg Test, Ur: NEGATIVE

## 2014-07-31 MED ORDER — ONDANSETRON 4 MG PO TBDP: 4.0000 mg | ORAL_TABLET | Freq: Three times a day (TID) | ORAL | Status: DC | PRN

## 2014-07-31 MED ORDER — SODIUM CHLORIDE 0.9 % IV BOLUS (SEPSIS)
1000.0000 mL | Freq: Once | INTRAVENOUS | Status: AC
  Administered 2014-07-31: 1000 mL via INTRAVENOUS

## 2014-07-31 MED ORDER — ONDANSETRON HCL 4 MG/2ML IJ SOLN
4.0000 mg | Freq: Once | INTRAMUSCULAR | Status: AC
  Administered 2014-07-31: 4 mg via INTRAVENOUS
  Filled 2014-07-31: qty 2

## 2014-07-31 NOTE — ED Notes (Signed)
Pt given a sprite 

## 2014-07-31 NOTE — ED Notes (Signed)
The pt is c/0 vomiting no diarrhea since Monday .  She has not had A BM SINCE LAST WEEK.  Mild lower back pain.  lmp feb 5th

## 2014-07-31 NOTE — ED Provider Notes (Signed)
CSN: [id number]     Arrival date & time 07/31/14  1755 History   First MD Initiated Contact with Patient 07/31/14 2050     Chief Complaint  Patient presents with  . Emesis     (Consider location/radiation/quality/duration/timing/severity/associated sxs/prior Treatment) HPI Comments: Is a 24 year old normally healthy college student who reports 2 days of intermittent episodes of vomiting.  She's been able to sip on fluids in small amounts.  She denies any diarrhea or history of constipation.  Denies vaginal discharge, dysuria states she is urinating normally.  6.  No medication for any chronic medical illnesses  Patient is a 24 y.o. female presenting with vomiting. The history is provided by the patient.  Emesis Severity:  Moderate Duration:  2 days Timing:  Intermittent Quality:  Bilious material Able to tolerate:  Liquids Progression:  Unchanged Chronicity:  New Recent urination:  Normal Relieved by:  None tried Worsened by:  Nothing tried Ineffective treatments:  None tried Associated symptoms: no abdominal pain, no cough, no diarrhea, no fever, no myalgias and no sore throat   Risk factors: not pregnant now, no suspect food intake and no travel to endemic areas     Past Medical History  Diagnosis Date  . Peripheral neuropathy    History reviewed. No pertinent past surgical history. No family history on file. History  Substance Use Topics  . Smoking status: Never Smoker   . Smokeless tobacco: Never Used  . Alcohol Use: No   OB History    No data available     Review of Systems  Constitutional: Negative for fever.  HENT: Negative for sore throat.   Respiratory: Negative for cough.   Gastrointestinal: Positive for nausea and vomiting. Negative for abdominal pain, diarrhea and constipation.  Genitourinary: Negative for dysuria and frequency.  Musculoskeletal: Negative for myalgias.  Skin: Negative for rash.  All other systems reviewed and are  negative.     Allergies  Review of patient's allergies indicates no known allergies.  Home Medications   Prior to Admission medications   Medication Sig Start Date End Date Taking? Authorizing Provider  ibuprofen (ADVIL,MOTRIN) 200 MG tablet Take 400 mg by mouth every 6 (six) hours as needed for moderate pain.   Yes Historical Provider, MD  IRON PO Take 1 tablet by mouth daily. Over the counter iron   Yes Historical Provider, MD  HYDROcodone-acetaminophen (NORCO/VICODIN) 5-325 MG per tablet Take 1-2 tablets by mouth every 6 (six) hours as needed for moderate pain or severe pain. 05/22/14   Roxy Horseman, PA-C  naproxen (NAPROSYN) 500 MG tablet Take 1 tablet (500 mg total) by mouth 2 (two) times daily with a meal. 11/13/13   Vida Roller, MD  ondansetron (ZOFRAN ODT) 4 MG disintegrating tablet Take 1 tablet (4 mg total) by mouth every 8 (eight) hours as needed for nausea or vomiting. 07/31/14   Arman Filter, NP  oxyCODONE-acetaminophen (PERCOCET/ROXICET) 5-325 MG per tablet Take 1 tablet by mouth every 6 (six) hours as needed for severe pain. 11/07/13   Shon Baton, MD   BP 117/94 mmHg  Pulse 67  Temp(Src) 98 F (36.7 C) (Oral)  Resp 18  SpO2 100%  LMP 07/19/2014 Physical Exam  Constitutional: She is oriented to person, place, and time. She appears well-nourished.  HENT:  Head: Normocephalic.  Eyes: Pupils are equal, round, and reactive to light.  Neck: Normal range of motion.  Cardiovascular: Normal rate and regular rhythm.   Pulmonary/Chest: Effort normal and breath sounds  normal.  Abdominal: She exhibits no distension. There is no tenderness.  Musculoskeletal: Normal range of motion.  Neurological: She is alert and oriented to person, place, and time.  Skin: Skin is warm and dry. No rash noted.  Nursing note and vitals reviewed.   ED Course  Procedures (including critical care time) Labs Review Labs Reviewed  CBC WITH DIFFERENTIAL/PLATELET - Abnormal; Notable for  the following:    Monocytes Relative 15 (*)    All other components within normal limits  BASIC METABOLIC PANEL - Abnormal; Notable for the following:    Anion gap 4 (*)    All other components within normal limits  URINALYSIS, ROUTINE W REFLEX MICROSCOPIC - Abnormal; Notable for the following:    Color, Urine AMBER (*)    Specific Gravity, Urine 1.034 (*)    Bilirubin Urine SMALL (*)    Ketones, ur 15 (*)    All other components within normal limits  PREGNANCY, URINE    Imaging Review No results found.   EKG Interpretation None     Condition does not appear to be any distress.  Vital signs are normal.  She'll be given a liter of saline and for Zofran followed by a fluid challenge She is tolerating fluids MDM   Final diagnoses:  Non-intractable vomiting with nausea, vomiting of unspecified type         Arman FilterGail K Orissa Arreaga, NP 07/31/14 2251  Glynn OctaveStephen Rancour, MD 07/31/14 2304

## 2014-07-31 NOTE — Discharge Instructions (Signed)

## 2014-07-31 NOTE — ED Notes (Signed)
NAD at this time. Pt is stable and leaving with her friend. 

## 2014-08-16 ENCOUNTER — Emergency Department (HOSPITAL_COMMUNITY)
Admission: EM | Admit: 2014-08-16 | Discharge: 2014-08-16 | Disposition: A | Discharge status: Home / Self Care | Payer: Medicaid Other | Attending: Emergency Medicine | Admitting: Emergency Medicine

## 2014-08-16 ENCOUNTER — Encounter (HOSPITAL_COMMUNITY): Payer: Self-pay | Admitting: Family Medicine

## 2014-08-16 ENCOUNTER — Emergency Department (HOSPITAL_COMMUNITY): Payer: Medicaid Other

## 2014-08-16 DIAGNOSIS — B349 Viral infection, unspecified: Secondary | ICD-10-CM

## 2014-08-16 DIAGNOSIS — J40 Bronchitis, not specified as acute or chronic: Secondary | ICD-10-CM

## 2014-08-16 DIAGNOSIS — Z79899 Other long term (current) drug therapy: Secondary | ICD-10-CM | POA: Insufficient documentation

## 2014-08-16 DIAGNOSIS — M791 Myalgia: Secondary | ICD-10-CM | POA: Insufficient documentation

## 2014-08-16 DIAGNOSIS — J209 Acute bronchitis, unspecified: Secondary | ICD-10-CM | POA: Insufficient documentation

## 2014-08-16 DIAGNOSIS — Z3202 Encounter for pregnancy test, result negative: Secondary | ICD-10-CM | POA: Insufficient documentation

## 2014-08-16 DIAGNOSIS — R059 Cough, unspecified: Secondary | ICD-10-CM

## 2014-08-16 DIAGNOSIS — Z8669 Personal history of other diseases of the nervous system and sense organs: Secondary | ICD-10-CM | POA: Insufficient documentation

## 2014-08-16 DIAGNOSIS — R05 Cough: Secondary | ICD-10-CM

## 2014-08-16 DIAGNOSIS — Z791 Long term (current) use of non-steroidal anti-inflammatories (NSAID): Secondary | ICD-10-CM | POA: Insufficient documentation

## 2014-08-16 LAB — RAPID STREP SCREEN (MED CTR MEBANE ONLY): STREPTOCOCCUS, GROUP A SCREEN (DIRECT): NEGATIVE

## 2014-08-16 LAB — POC URINE PREG, ED: PREG TEST UR: NEGATIVE

## 2014-08-16 MED ORDER — ALBUTEROL SULFATE HFA 108 (90 BASE) MCG/ACT IN AERS
2.0000 | INHALATION_SPRAY | Freq: Four times a day (QID) | RESPIRATORY_TRACT | Status: DC | PRN
  Administered 2014-08-16: 2 via RESPIRATORY_TRACT
  Filled 2014-08-16: qty 6.7

## 2014-08-16 MED ORDER — ALBUTEROL SULFATE (2.5 MG/3ML) 0.083% IN NEBU
5.0000 mg | INHALATION_SOLUTION | Freq: Once | RESPIRATORY_TRACT | Status: AC
  Administered 2014-08-16: 5 mg via RESPIRATORY_TRACT
  Filled 2014-08-16: qty 6

## 2014-08-16 MED ORDER — AZITHROMYCIN 250 MG PO TABS: 250.0000 mg | ORAL_TABLET | Freq: Every day | ORAL | Status: DC

## 2014-08-16 NOTE — ED Notes (Signed)
Pt reports generalized body aches x3 days with sore throat and fever. Pt afebrile currently, denies any n/v/d/ or abd pain. Denies any cp or sob. reddness noted to throat. Lung sounds clear.

## 2014-08-16 NOTE — Discharge Instructions (Signed)
Please call your doctor for a followup appointment within 24-48 hours. When you talk to your doctor please let them know that you were seen in the emergency department and have them acquire all of your records so that they can discuss the findings with you and formulate a treatment plan to fully care for your new and ongoing problems. Please rest and stay hydrated Please follow-up with health and wellness Center Please take antibiotics as prescribed and on a full stomach for they can cause diarrhea Please drink plenty of water Please use albuterol inhaler as needed every 6 hours, 2 puffs Please continue to monitor symptoms closely and if symptoms are to worsen or change (fever greater than 101, chills, sweating, nausea, vomiting, chest pain, shortness of breathe, difficulty breathing, weakness, numbness, tingling, worsening or changes to pain pattern, dizziness, inability keep food fluids down, changes to bowel movements or urination, coughing up blood) please report back to the Emergency Department immediately.   Acute Bronchitis Bronchitis is inflammation of the airways that extend from the windpipe into the lungs (bronchi). The inflammation often causes mucus to develop. This leads to a cough, which is the most common symptom of bronchitis.  In acute bronchitis, the condition usually develops suddenly and goes away over time, usually in a couple weeks. Smoking, allergies, and asthma can make bronchitis worse. Repeated episodes of bronchitis may cause further lung problems.  CAUSES Acute bronchitis is most often caused by the same virus that causes a cold. The virus can spread from person to person (contagious) through coughing, sneezing, and touching contaminated objects. SIGNS AND SYMPTOMS   Cough.   Fever.   Coughing up mucus.   Body aches.   Chest congestion.   Chills.   Shortness of breath.   Sore throat.  DIAGNOSIS  Acute bronchitis is usually diagnosed through a  physical exam. Your health care provider will also ask you questions about your medical history. Tests, such as chest X-rays, are sometimes done to rule out other conditions.  TREATMENT  Acute bronchitis usually goes away in a couple weeks. Oftentimes, no medical treatment is necessary. Medicines are sometimes given for relief of fever or cough. Antibiotic medicines are usually not needed but may be prescribed in certain situations. In some cases, an inhaler may be recommended to help reduce shortness of breath and control the cough. A cool mist vaporizer may also be used to help thin bronchial secretions and make it easier to clear the chest.  HOME CARE INSTRUCTIONS  Get plenty of rest.   Drink enough fluids to keep your urine clear or pale yellow (unless you have a medical condition that requires fluid restriction). Increasing fluids may help thin your respiratory secretions (sputum) and reduce chest congestion, and it will prevent dehydration.   Take medicines only as directed by your health care provider.  If you were prescribed an antibiotic medicine, finish it all even if you start to feel better.  Avoid smoking and secondhand smoke. Exposure to cigarette smoke or irritating chemicals will make bronchitis worse. If you are a smoker, consider using nicotine gum or skin patches to help control withdrawal symptoms. Quitting smoking will help your lungs heal faster.   Reduce the chances of another bout of acute bronchitis by washing your hands frequently, avoiding people with cold symptoms, and trying not to touch your hands to your mouth, nose, or eyes.   Keep all follow-up visits as directed by your health care provider.  SEEK MEDICAL CARE IF: Your symptoms  do not improve after 1 week of treatment.  SEEK IMMEDIATE MEDICAL CARE IF:  You develop an increased fever or chills.   You have chest pain.   You have severe shortness of breath.  You have bloody sputum.   You develop  dehydration.  You faint or repeatedly feel like you are going to pass out.  You develop repeated vomiting.  You develop a severe headache. MAKE SURE YOU:   Understand these instructions.  Will watch your condition.  Will get help right away if you are not doing well or get worse. Document Released: 07/08/2004 Document Revised: 10/15/2013 Document Reviewed: 11/21/2012 [facility] Patient Information 2015 [place], [place]. This information is not intended to replace advice given to you by your health care provider. Make sure you discuss any questions you have with your health care provider.   Emergency Department Resource Guide 1) Find a Doctor and Pay Out of Pocket Although you won't have to find out who is covered by your insurance plan, it is a good idea to ask around and get recommendations. You will then need to call the office and see if the doctor you have chosen will accept you as a new patient and what types of options they offer for patients who are self-pay. Some doctors offer discounts or will set up payment plans for their patients who do not have insurance, but you will need to ask so you aren't surprised when you get to your appointment.  2) Contact Your Local Health Department Not all health departments have doctors that can see patients for sick visits, but many do, so it is worth a call to see if yours does. If you don't know where your local health department is, you can check in your phone book. The CDC also has a tool to help you locate your state's health department, and many state websites also have listings of all of their local health departments.  3) Find a Walk-in Clinic If your illness is not likely to be very severe or complicated, you may want to try a walk in clinic. These are popping up all over the country in pharmacies, drugstores, and shopping centers. They're usually staffed by nurse practitioners or physician assistants that have been trained to treat common  illnesses and complaints. They're usually fairly quick and inexpensive. However, if you have serious medical issues or chronic medical problems, these are probably not your best option.  No Primary Care Doctor: - Call Health Connect at  [phone] - they can help you locate a primary care doctor that  accepts your insurance, provides certain services, etc. - Physician Referral Service- [phone]  Chronic Pain Problems: Organization         Address  Phone   Notes  [facility]  [phone] Patients need to be referred by their primary care doctor.   Medication Assistance: Organization         Address  Phone   Notes  [facility] Medication [facility] [address] [place], [place] [address] [phone] --Must be a resident of [facility] -- Must have NO insurance coverage whatsoever (no Medicaid/ Medicare, etc.) -- The pt. MUST have a primary care doctor that directs their care regularly and follows them in the community   MedAssist  [phone]   [employer]  [phone]    Agencies that provide inexpensive medical care: Retail buyer  Notes  [facility]  [phone]   [facility]    [phone]) [phone]   [facility] [address] [place], [place] [address] [phone]   [facility] [address], [place] [phone]   Planned Parenthood    [phone]   [facility]    [phone]   [facility]  [address], [place] Phone:  [phone]) [phone], Fax:  [phone] Hours of Operation:  9 am - 6 pm, M-F.  Also accepts Medicaid/Medicare and self-pay.  [facility]  [address], [place] Phone: [phone](336) [phone], Fax: [phone]) [phone]. Hours of Operation:  8:30 am - 5:30 pm, M-F.  Also accepts Medicaid and self-pay.  [facility] [place]  [address], [place] Phone: [phone]) [phone]   [facility] [address], [place], [place] [phone], Ext. [phone] Mondays & Thursdays: 7-9 AM.  First 15 patients are seen on a first come, first serve basis.    Medicaid-accepting [facility] Providers:  Organization         Address  Phone   Notes  [facility] [address], [place] [phone] Also accepts self-pay patients.  [facility] [address], [place]  [phone]) [phone]   [facility] [address], [address], [place] [phone]   [facility] [address] [place] [address], [place] [phone]) [phone]   Renaye RakersVeita Bland [address], [place]   [phone] Only accepts [place] Access [place]icaid patients after they have their name applied to their card.   Self-Pay (no insurance) in [facility] [place]:  Organization         Address  Phone   Notes  Sickle Cell Patients, [facility] [address], [place] [phone]   [facility] [address], [place] [phone]   [facility] [place]  [address], [place] [phone]) [phone]   [facility]/Dr. Osei-Bonsu  [address] [place] [address], [place] or [phone], [place] [phone] Phone number for both [place] and [place] locations is the same.  [facility] [address], [place] [phone](336) [phone]   [facility]  [address] [place] [address], [place] or [address] [phone](336) [phone]   [facility] [address], [place] [phone], phone; [phone]) [phone], fax Sees patients 1st and 3rd Saturday of every month.  Must not qualify for public or private insurance (i.e. Medicaid, Medicare, [place] Health Choice, Veterans' Benefits)  Household income should be no more than 200% of the  poverty level The clinic cannot treat you if you are pregnant or think you are pregnant  Sexually transmitted diseases are not treated at the clinic.    Dental Care: Organization         Address  Phone  Notes  [facility] Department of [facility] [address], [place] [phone] Accepts children up to age 23 who are enrolled in [place]icaid or [place] Health Choice; pregnant women with a Medicaid card; and children who have applied for Medicaid or  Health Choice, but were declined, whose parents can pay a reduced fee at time of service.  [facility]  Department of [facility] [place]  [address], [place] [phone]) [phone] Accepts children up to age 23 who are enrolled in [place]icaid or [place] Health Choice; pregnant women with a Medicaid card; and children who have applied for Medicaid or [place] Health Choice, but were declined, whose parents can pay a reduced fee at time of service.  [facility]  [address], [place] [phone]) [phone] Patients are seen by appointment only. Walk-ins are not accepted. [facility] will see patients 23 years of age and older. Monday - Tuesday (8am-5pm) Most Wednesdays (8:30-5pm) $30 per visit, cash only  [facility]  [address], [facility] [phone] Patients are seen by appointment only. Walk-ins are not accepted. [facility] will see patients 23 years of age and older. One Wednesday Evening (Monthly: Volunteer Based).  $30 per visit, cash only  [employer] [facility] [facility]  [phone] for adults; Children under age 734, call Graduate Pediatric Dentistry at [phone]. Children aged 614-14, please call [phone] to request a pediatric application.  Dental services are provided in all areas of dental care including fillings, crowns and bridges, complete and partial dentures, implants, gum treatment, root canals, and extractions.  Preventive care is also provided. Treatment is provided to both adults and children. Patients are selected via a lottery and there is often a waiting list.   [facility] [address], [place]  [phone] [email or web address]   [facility] [address], [place], [place] [phone], Ext. [phone] Second and Fourth Thursday of each month, opens at 6:30 AM; Clinic ends at 9 AM.  Patients are seen on a first-come first-served basis, and a limited number are seen during each clinic.   [facility]  [address], [place], [place] [phone]   Eligibility Requirements You must have lived in [place], [place], or [place] for at least the last three months.   You cannot be eligible for state or federal sponsored National Cityhealthcare insurance, including CIGNAVeterans Administration, [place]icaid, or [employer] EntertainmentMedicare.   You generally cannot be eligible for healthcare insurance through your employer.    How to apply: Eligibility screenings are held every Tuesday and Wednesday afternoon from 1:00 pm until 4:00 pm. You do not need an appointment for the interview!  [facility] [address], [place], [place] [phone]   [facility]  [phone]   [facility]  [phone]   [facility]  [phone]    Behavioral Health Resources in the Community: Intensive Outpatient Programs Organization         Address  Phone  Notes  [facility] [address], [place], [place] [phone]   [facility] [address], [place], [place] [phone]   [facility] [address], [place], [place]  [phone]   [facility] [address] [place] [address],  [place], [place] [phone] or [phone]   Substance Abuse Resources Organization         Address  Phone  Notes  [facility]  [phone]   Addiction  [facility]  [phone]   [facility]  [phone]   [facility]  [phone]   Residential & Outpatient Substance Abuse Program  [phone]   Psychological Services Organization         Address  Phone  Notes  [facility]  [phone]- [phone]   [facility]  [phone]- [phone]   [facility] [address], [place] [phone] or [phone]    Mobile Crisis Teams Organization         Address  Phone  Notes  [facility], Mobile Crisis Care Unit  [phone]   [facility]  [address] [place], [place] [phone]   Doristine LocksSharon DeEsch [address] [place] [phone]    Self-Help/Support Groups Organization         Address  Phone             Notes  [facility] [place] - variety of support groups  [phone]- [phone] Call for more information  Narcotics Anonymous (NA), [facility]aring [facility] [address], [place]  2 meetings at this location   Statisticianesidential Treatment Programs Organization         Address  Phone  Notes  [facility] [address],    [place]  [phone]   [facility]  [address], [place] [phone], [place], [place] [phone]   [facility] [address], [place] [phone] Admissions: 8am-3pm M-F  [facility] [address]    [place], [place] [phone]   [facility] [address], [place], [place] [phone]   [facility] [address]  [place], [place] [phone]   [facility] - Intensive Outpatient [address], [place], [place] [phone]   [facility] (Addiction Recovery [facility] [address]  [place], [place] [phone] or [phone]   [facility] (RTS) [address] [place], [place] [phone] Accepts Medicaid  [facility] [address]  [place]  [phone] Substance Abuse/Addiction Treatment   [facility] Behavioral Health Resources Organization         Address  Phone  Notes  [facility]  [phone]   Angie FavaJulie Brannon, PhD [address], [address] [place], [place]   [phone] or [phone](336) [phone]   [facility]    [address] [place], [place] [phone]   [facility] [address], [place], [place] [phone] Insurance/Medicaid/sponsorship through [facility]  [facility] Families [address]                                    [place], [place] [phone] Therapy/tele-psych/case  [facility] [address].   [place], [place] [phone]    Dr. Lolly MustacheArfeen  [phone]   [facility] [place]  [employer] [facility] 1) [address], [place] 2) [address], [place] 3)  [address], [place] [phone]  [phone]) [phone]   [facility] Child Abuse Hotline [phone]) [phone] or [phone](336) [phone] (After Hours)

## 2014-08-16 NOTE — ED Provider Notes (Signed)
CSN: [id number]     Arrival date & time 08/16/14  1551 History  This chart was scribed for non-physician practitioner, Raymon Mutton PA, working with Tilden Fossa, MD, by Tanda Rockers, ED Scribe. This patient was seen in room TR06C/TR06C and the patient's care was started at [facility].      Chief Complaint  Patient presents with  . URI   The history is provided by the patient. No language interpreter was used.     HPI Comments: Kathleen Rice is a 24 y.o. female with history of peripheral neuropathy who presents to the Emergency Department complaining of chest tightness, shortness of breath, sore throat, and productive cough that began 5 days ago. Pt reports that her cough has been accompanied by whitish green phlegm and speckles of blood. She mentions that the chest tightness is exacerbated upon coughing. She also complains of fever, nasal congestion, loss of appetite, and myalgias. She states that 1 day ago she had a fever of 103 degrees. Pt has taken OTC Tylenol, Dayquil, Nyquil, Alka seltzer, and Mucinex with no relief. Pt admits that she did not receive the flu vaccine this year. She denies blurred vision or loss of vision, ear pain, urinary or bowel changes, nausea, vomiting, abdominal pain, difficulty swallowing, prolonged travel, or recent sick contact with similar symptoms. She denies smoking cigarettes. LNMP: Monday, 08/12/14  PCP- None   Past Medical History  Diagnosis Date  . Peripheral neuropathy    History reviewed. No pertinent past surgical history. History reviewed. No pertinent family history. History  Substance Use Topics  . Smoking status: Never Smoker   . Smokeless tobacco: Never Used  . Alcohol Use: No   OB History    No data available     Review of Systems  Constitutional: Positive for fever. Negative for chills.  HENT: Positive for congestion and sore throat. Negative for ear discharge, ear pain and trouble swallowing.   Eyes: Negative for pain and  visual disturbance.  Respiratory: Positive for cough, chest tightness and shortness of breath.   Gastrointestinal: Negative for nausea, vomiting, abdominal pain, diarrhea and constipation.  Genitourinary:       No urinary changes.   Musculoskeletal: Positive for myalgias (Generalized).      Allergies  Review of patient's allergies indicates no known allergies.  Home Medications   Prior to Admission medications   Medication Sig Start Date End Date Taking? Authorizing Provider  azithromycin (ZITHROMAX) 250 MG tablet Take 1 tablet (250 mg total) by mouth daily. Take first 2 tablets together, then 1 every day until finished. 08/16/14   Elodie Panameno, PA-C  HYDROcodone-acetaminophen (NORCO/VICODIN) 5-325 MG per tablet Take 1-2 tablets by mouth every 6 (six) hours as needed for moderate pain or severe pain. 05/22/14   Roxy Horseman, PA-C  ibuprofen (ADVIL,MOTRIN) 200 MG tablet Take 400 mg by mouth every 6 (six) hours as needed for moderate pain.    Historical Provider, MD  IRON PO Take 1 tablet by mouth daily. Over the counter iron    Historical Provider, MD  naproxen (NAPROSYN) 500 MG tablet Take 1 tablet (500 mg total) by mouth 2 (two) times daily with a meal. 11/13/13   Vida Roller, MD  ondansetron (ZOFRAN ODT) 4 MG disintegrating tablet Take 1 tablet (4 mg total) by mouth every 8 (eight) hours as needed for nausea or vomiting. 07/31/14   Arman Filter, NP  oxyCODONE-acetaminophen (PERCOCET/ROXICET) 5-325 MG per tablet Take 1 tablet by mouth every 6 (six) hours  as needed for severe pain. 11/07/13   Shon Baton, MD   Triage Vitals: BP 109/77 mmHg  Pulse 67  Temp(Src) 98.4 F (36.9 C)  Resp 18  SpO2 100%  LMP 07/19/2014   Physical Exam  Constitutional: She is oriented to person, place, and time. She appears well-developed and well-nourished. No distress.  HENT:  Head: Normocephalic and atraumatic.  Right Ear: Hearing, tympanic membrane, external ear and ear canal normal.  Left  Ear: Hearing, tympanic membrane, external ear and ear canal normal.  Mouth/Throat: Oropharynx is clear and moist. No trismus in the jaw. No uvula swelling or lacerations. No oropharyngeal exudate, posterior oropharyngeal edema, posterior oropharyngeal erythema or tonsillar abscesses.  Eyes: Conjunctivae and EOM are normal. Pupils are equal, round, and reactive to light. Right eye exhibits no discharge. Left eye exhibits no discharge.  Neck: Normal range of motion. Neck supple. No tracheal deviation present.  Negative neck stiffness Negative nuchal rigidity Negative cervical lymphadenopathy Negative meningeal signs  Cardiovascular: Normal rate, regular rhythm and normal heart sounds.  Exam reveals no friction rub.   No murmur heard. Pulses:      Radial pulses are 2+ on the right side, and 2+ on the left side.  Pulmonary/Chest: Effort normal and breath sounds normal. No respiratory distress. She has no wheezes. She has no rales. She exhibits tenderness (reproducible upon palpation ).  Patient is able to speak in full sentences without difficulty Negative use of accessory muscles Negative stridor  Musculoskeletal: Normal range of motion.  Lymphadenopathy:    She has no cervical adenopathy.  Neurological: She is alert and oriented to person, place, and time. No cranial nerve deficit. She exhibits normal muscle tone. Coordination normal.  Skin: Skin is warm and dry. No rash noted. She is not diaphoretic. No erythema.  Psychiatric: She has a normal mood and affect. Her behavior is normal.  Nursing note and vitals reviewed.   ED Course  Procedures (including critical care time)  DIAGNOSTIC STUDIES: Oxygen Saturation is 100% on RA, normal by my interpretation.    COORDINATION OF CARE: 5:09 PM-Discussed treatment plan which includes CXR, Rapid strep test, and breathing treatment with pt at bedside and pt agreed to plan.   Results for orders placed or performed during the hospital encounter  of 08/16/14  Rapid strep screen  Result Value Ref Range   Streptococcus, Group A Screen (Direct) NEGATIVE NEGATIVE  POC urine preg, ED (not at [facility])  Result Value Ref Range   Preg Test, Ur NEGATIVE NEGATIVE    Labs Review Labs Reviewed  RAPID STREP SCREEN  CULTURE, GROUP A STREP  POC URINE PREG, ED    Imaging Review Dg Chest 2 View  08/16/2014   CLINICAL DATA:  Cough.  Congestion.  Body aches.  Smoker.  EXAM: CHEST  2 VIEW  COMPARISON:  11/07/2013  FINDINGS: Midline trachea.  Normal heart size and mediastinal contours.  Sharp costophrenic angles.  No pneumothorax.  Clear lungs.  IMPRESSION: No active cardiopulmonary disease.   Electronically Signed   By: Jeronimo Greaves M.D.   On: 08/16/2014 16:44     EKG Interpretation None      6:33 PM Patient re-assessed. Reported that she is feeling better with light to go home. Discussed plan for discharge. Patient is in accordance. Discharged patient.  MDM   Final diagnoses:  Bronchitis  Viral syndrome    Medications  albuterol (PROVENTIL HFA;VENTOLIN HFA) 108 (90 BASE) MCG/ACT inhaler 2 puff (not administered)  albuterol (PROVENTIL) (2.5  MG/3ML) 0.083% nebulizer solution 5 mg (5 mg Nebulization Given 08/16/14 1727)    Filed Vitals:   08/16/14 1557  BP: 109/77  Pulse: 67  Temp: 98.4 F (36.9 C)  Resp: 18  SpO2: 100%   I personally performed the services described in this documentation, which was scribed in my presence. The recorded information has been reviewed and is accurate.  Patient presenting to emergency department with nasal congestion, productive cough of yellow/whitish phlegm, intermittent fever, generalized bodyaches, chest tightness and shortness of breath that has been ongoing for the past week. Patient reported that she's been using over-the-counter medications without relief. EKG normal sinus rhythm with a heart rate of 78 bpm. Rapid strep test negative. Urine pregnancy negative. Chest x-ray no active cord upon disease  noted. Well's PE-negative-doubt PE. Doubt ACS. Negative findings of pneumonia. Doubt streptococcal pharyngitis. Doubt peritonsillar abscess. Doubt retropharyngeal abscess. Suspicion to be bronchitis, viral syndrome. Patient stable, afebrile. Patient not septic appearing. Discharged patient. Discharge patient with antibiotics and albuterol inhaler to use as needed every 4 hours. Discussed with patient to rest and stay hydrated. [facility]. Discussed with patient to closely monitor symptoms and if symptoms are to worsen or change to report back to the ED - strict return instructions given.  Patient agreed to plan of care, understood, all questions answered.   Raymon MuttonMarissa Takari Duncombe, PA-C 08/16/14 1836  Tilden FossaElizabeth Rees, MD 08/16/14 2330

## 2014-08-16 NOTE — ED Notes (Signed)
Pt having cough, congestion, body aches. sts she cant taster her food.

## 2014-08-16 NOTE — ED Notes (Signed)
Patient transported to X-ray 

## 2014-08-19 LAB — CULTURE, GROUP A STREP

## 2015-07-11 ENCOUNTER — Emergency Department
Admission: EM | Admit: 2015-07-11 | Discharge: 2015-07-11 | Disposition: A | Discharge status: Home / Self Care | Payer: Self-pay | Attending: Emergency Medicine | Admitting: Emergency Medicine

## 2015-07-11 ENCOUNTER — Emergency Department: Payer: Medicaid Other

## 2015-07-11 ENCOUNTER — Encounter: Payer: Self-pay | Admitting: Emergency Medicine

## 2015-07-11 DIAGNOSIS — R0781 Pleurodynia: Secondary | ICD-10-CM

## 2015-07-11 DIAGNOSIS — J069 Acute upper respiratory infection, unspecified: Secondary | ICD-10-CM | POA: Insufficient documentation

## 2015-07-11 DIAGNOSIS — R0789 Other chest pain: Secondary | ICD-10-CM | POA: Insufficient documentation

## 2015-07-11 MED ORDER — GUAIFENESIN-CODEINE 100-10 MG/5ML PO SOLN: 10.0000 mL | Freq: Three times a day (TID) | ORAL | Status: DC | PRN

## 2015-07-11 MED ORDER — PREDNISONE 10 MG (21) PO TBPK: ORAL_TABLET | ORAL | Status: DC

## 2015-07-11 NOTE — ED Provider Notes (Signed)
[facility] Emergency Department Provider Note  ____________________________________________  Time seen: Approximately 6:28 PM  I have reviewed the triage vital signs and the nursing notes.   HISTORY  Chief Complaint Cough   HPI Kathleen Rice is a 25 y.o. female who presents to the emergency department for evaluation of cough, fever, chest congestion, chest pain, and back pain. Symptoms started Monday.She has had no relief with OTC cold medications. She reports hemoptysis 2 days ago, but none since. She has pain in her chest and back with deep inspiration or cough.   Past Medical History  Diagnosis Date  . Peripheral neuropathy (HCC)     There are no active problems to display for this patient.   History reviewed. No pertinent past surgical history.  Current Outpatient Rx  Name  Route  Sig  Dispense  Refill  . guaiFENesin-codeine 100-10 MG/5ML syrup   Oral   Take 10 mLs by mouth 3 (three) times daily as needed.   120 mL   0   . IRON PO   Oral   Take 1 tablet by mouth daily. Over the counter iron         . predniSONE (STERAPRED UNI-PAK 21 TAB) 10 MG (21) TBPK tablet      Take 6 tablets on day 1 Take 5 tablets on day 2 Take 4 tablets on day 3 Take 3 tablets on day 4 Take 2 tablets on day 5 Take 1 tablet on day 6   21 tablet   0     Allergies Review of patient's allergies indicates no known allergies.  History reviewed. No pertinent family history.  Social History Social History  Substance Use Topics  . Smoking status: Never Smoker   . Smokeless tobacco: Never Used  . Alcohol Use: No    Review of Systems Constitutional: No fever/chills Eyes: No visual changes. ENT: No sore throat. Cardiovascular: Positive for chest pain with deep inspiration or cough.  Respiratory: Negative for shortness of breath. Positive for cough. Gastrointestinal: Negative for abdominal pain. No nausea,  no vomiting.  No diarrhea.   Genitourinary: Negative for dysuria. Musculoskeletal: Negative for body aches Skin: Negative for rash. Neurological: Negative for headaches, Negative for focal weakness or numbness.  10-point ROS otherwise negative.  ____________________________________________   PHYSICAL EXAM:  VITAL SIGNS: ED Triage Vitals  Enc Vitals Group     BP 07/11/15 1819 118/63 mmHg     Pulse Rate 07/11/15 1819 68     Resp 07/11/15 1819 16     Temp 07/11/15 1819 98.1 F (36.7 C)     Temp Source 07/11/15 1819 Oral     SpO2 07/11/15 1819 98 %     Weight 07/11/15 1819 137 lb (62.143 kg)     Height 07/11/15 1819  (1.6 m)     Head Cir --      Peak Flow --      Pain Score 07/11/15 1813 10     Pain Loc --      Pain Edu? --      Excl. in GC? --     Constitutional: Alert and oriented. Well appearing and in no acute distress. Eyes: Conjunctivae are normal. PERRL. EOMI. Ears: Tympanic membranes normal Head: Atraumatic. Nose: No congestion/rhinnorhea. Mouth/Throat: Mucous membranes are moist.  Oropharynx without erythema or exudate.. Neck: No stridor.  Lymphatic: No cervical lymphadenopathy. Cardiovascular: Normal rate, regular rhythm. Grossly normal heart sounds.  Good peripheral circulation. Respiratory: Normal respiratory effort.  No  retractions. Clear to auscultation throughout. Gastrointestinal: Soft and nontender. No distention. No abdominal bruits. No CVA tenderness. Musculoskeletal: No joint pain reported. Neurologic:  Normal speech and language. No gross focal neurologic deficits are appreciated. Speech is normal. No gait instability. Skin:  Skin is warm, dry and intact. No rash noted. Psychiatric: Mood and affect are normal. Speech and behavior are normal.  ____________________________________________   LABS (all labs ordered are listed, but only abnormal results are displayed)  Labs Reviewed - No data to display ____________________________________________  EKG  Not  indicated ____________________________________________  RADIOLOGY  No indication of opacity or infiltrate per radiology. ____________________________________________   PROCEDURES  Procedure(s) performed: None  Critical Care performed: No  ____________________________________________   INITIAL IMPRESSION / ASSESSMENT AND PLAN / ED COURSE  Pertinent labs & imaging results that were available during my care of the patient were reviewed by me and considered in my medical decision making (see chart for details).   Patient will be treated with prednisone and Robitussin-AC. She was advised follow-up with her primary care provider for symptoms that are not improving over the next 2-3 days. She was advised to return to the emergency department for symptoms change or worsen if she is unable to schedule an appointment. ____________________________________________   FINAL CLINICAL IMPRESSION(S) / ED DIAGNOSES  Final diagnoses:  Pleuritic chest pain  Upper respiratory infection, acute       Chinita Pester, FNP 07/11/15 2024  Jeanmarie Plant, MD 07/11/15 [phone]

## 2015-07-11 NOTE — ED Notes (Addendum)
Pt has had cough X 5 days.  Reports feels like when she had PNA.  Had small amount of blood with cough once 2 days ago.  Chest hurts with coughing and breathing.  No respiratory distress.

## 2015-07-11 NOTE — Discharge Instructions (Signed)

## 2015-11-25 ENCOUNTER — Emergency Department (HOSPITAL_COMMUNITY)
Admission: EM | Admit: 2015-11-25 | Discharge: 2015-11-25 | Disposition: A | Discharge status: Home / Self Care | Payer: BLUE CROSS/BLUE SHIELD | Attending: Emergency Medicine | Admitting: Emergency Medicine

## 2015-11-25 ENCOUNTER — Emergency Department (HOSPITAL_COMMUNITY): Payer: BLUE CROSS/BLUE SHIELD

## 2015-11-25 ENCOUNTER — Encounter (HOSPITAL_COMMUNITY): Payer: Self-pay

## 2015-11-25 DIAGNOSIS — K8020 Calculus of gallbladder without cholecystitis without obstruction: Secondary | ICD-10-CM | POA: Insufficient documentation

## 2015-11-25 DIAGNOSIS — R1011 Right upper quadrant pain: Secondary | ICD-10-CM | POA: Insufficient documentation

## 2015-11-25 DIAGNOSIS — R112 Nausea with vomiting, unspecified: Secondary | ICD-10-CM | POA: Insufficient documentation

## 2015-11-25 LAB — I-STAT BETA HCG BLOOD, ED (MC, WL, AP ONLY): I-stat hCG, quantitative: <5 m[IU]/mL (ref <5)

## 2015-11-25 LAB — COMPREHENSIVE METABOLIC PANEL
ALBUMIN: 3.9 g/dL (ref 3.5–5.0)
ALT: 11 U/L — AB (ref 14–54)
AST: 20 U/L (ref 15–41)
Alkaline Phosphatase: 53 U/L (ref 38–126)
Anion gap: 7 (ref 5–15)
BUN: 14 mg/dL (ref 6–20)
CHLORIDE: 106 mmol/L (ref 101–111)
CO2: 24 mmol/L (ref 22–32)
CREATININE: 0.81 mg/dL (ref 0.44–1.00)
Calcium: 9.1 mg/dL (ref 8.9–10.3)
GFR calc Af Amer: >60 mL/min (ref >60)
GFR calc non Af Amer: >60 mL/min (ref >60)
Glucose, Bld: 90 mg/dL (ref 65–99)
Potassium: 4.1 mmol/L (ref 3.5–5.1)
Sodium: 137 mmol/L (ref 135–145)
Total Bilirubin: 0.4 mg/dL (ref 0.3–1.2)
Total Protein: 7.2 g/dL (ref 6.5–8.1)

## 2015-11-25 LAB — CBC
HCT: 40.7 % (ref 36.0–46.0)
Hemoglobin: 12.6 g/dL (ref 12.0–15.0)
MCH: 25.4 pg — ABNORMAL LOW (ref 26.0–34.0)
MCHC: 31 g/dL (ref 30.0–36.0)
MCV: 81.9 fL (ref 78.0–100.0)
PLATELETS: 300 10*3/uL (ref 150–400)
RBC: 4.97 MIL/uL (ref 3.87–5.11)
RDW: 14.2 % (ref 11.5–15.5)
WBC: 11.8 10*3/uL — ABNORMAL HIGH (ref 4.0–10.5)

## 2015-11-25 LAB — URINALYSIS, ROUTINE W REFLEX MICROSCOPIC
Bilirubin Urine: NEGATIVE
GLUCOSE, UA: NEGATIVE mg/dL
Hgb urine dipstick: NEGATIVE
KETONES UR: NEGATIVE mg/dL
Leukocytes, UA: NEGATIVE
Nitrite: NEGATIVE
PH: 5.5 (ref 5.0–8.0)
Protein, ur: NEGATIVE mg/dL
Specific Gravity, Urine: 1.024 (ref 1.005–1.030)

## 2015-11-25 LAB — LIPASE, BLOOD: LIPASE: 25 U/L (ref 11–51)

## 2015-11-25 MED ORDER — ONDANSETRON HCL 4 MG PO TABS: 4.0000 mg | ORAL_TABLET | Freq: Four times a day (QID) | ORAL | Status: DC

## 2015-11-25 MED ORDER — MORPHINE SULFATE (PF) 4 MG/ML IV SOLN
4.0000 mg | Freq: Once | INTRAVENOUS | Status: AC
  Administered 2015-11-25: 4 mg via INTRAVENOUS
  Filled 2015-11-25: qty 1

## 2015-11-25 MED ORDER — HYDROCODONE-ACETAMINOPHEN 5-325 MG PO TABS: 1.0000 | ORAL_TABLET | Freq: Four times a day (QID) | ORAL | Status: DC | PRN

## 2015-11-25 MED ORDER — ONDANSETRON HCL 4 MG/2ML IJ SOLN
4.0000 mg | Freq: Once | INTRAMUSCULAR | Status: AC
  Administered 2015-11-25: 4 mg via INTRAVENOUS
  Filled 2015-11-25: qty 2

## 2015-11-25 NOTE — ED Notes (Signed)
Patient complains of gallbladder pain since last pm. States that she has known gallbladder disease and started at 10pm. Nausea and vomiting with same

## 2015-11-25 NOTE — ED Provider Notes (Signed)
CSN: [id number]     Arrival date & time 11/25/15  [id number] History   First MD Initiated Contact with Patient 11/25/15 1000     No chief complaint on file.    (Consider location/radiation/quality/duration/timing/severity/associated sxs/prior Treatment) HPI Comments: Patient with a history of Gallstones presents today with a chief complaint of RUQ abdominal pain.  She reports that the pain has been constant since last evening.  She ate a large meal of steak and mashed potatoes prior to the onset of pain.  Pain does not radiate.  She reports two episodes of associated vomiting.  No hematemesis.  No fever, chills, chest pain, SOB, or any other symptoms.  She has not taken anything for pain prior to arrival.  She states that the pain is similar to the pain that she has had in the past with Gallstones, but is lasting longer than usual.  She states that she has not been evaluated by Surgery or GI for this pain in the past.  The history is provided by the patient.    Past Medical History  Diagnosis Date  . Peripheral neuropathy (HCC)    History reviewed. No pertinent past surgical history. No family history on file. Social History  Substance Use Topics  . Smoking status: Never Smoker   . Smokeless tobacco: Never Used  . Alcohol Use: No   OB History    No data available     Review of Systems  All other systems reviewed and are negative.     Allergies  Review of patient's allergies indicates no known allergies.  Home Medications   Prior to Admission medications   Medication Sig Start Date End Date Taking? Authorizing Provider  ibuprofen (ADVIL,MOTRIN) 200 MG tablet Take 200 mg by mouth every 6 (six) hours as needed for moderate pain.   Yes Historical Provider, MD  guaiFENesin-codeine 100-10 MG/5ML syrup Take 10 mLs by mouth 3 (three) times daily as needed. Patient not taking: Reported on 11/25/2015 07/11/15   Chinita Pesterari B Triplett, FNP  predniSONE (STERAPRED UNI-PAK 21 TAB) 10 MG (21) TBPK  tablet Take 6 tablets on day 1 Take 5 tablets on day 2 Take 4 tablets on day 3 Take 3 tablets on day 4 Take 2 tablets on day 5 Take 1 tablet on day 6 Patient not taking: Reported on 11/25/2015 07/11/15   Chinita Pesterari B Triplett, FNP   BP 116/68 mmHg  Pulse 74  Temp(Src) 98.4 F (36.9 C) (Oral)  Resp 17  Ht 5\' 3"  (1.6 m)  Wt 64.864 kg  BMI 25.34 kg/m2  SpO2 100%  LMP 10/31/2015 Physical Exam  Constitutional: She appears well-developed and well-nourished.  HENT:  Head: Normocephalic and atraumatic.  Mouth/Throat: Oropharynx is clear and moist.  Neck: Normal range of motion. Neck supple.  Cardiovascular: Normal rate, regular rhythm and normal heart sounds.   Pulmonary/Chest: Effort normal and breath sounds normal.  Abdominal: Soft. Bowel sounds are normal. She exhibits no distension and no mass. There is tenderness in the right upper quadrant. There is positive Murphy's sign. There is no rebound and no guarding.  Neurological: She is alert.  Skin: Skin is warm and dry.  Psychiatric: She has a normal mood and affect.  Nursing note and vitals reviewed.   ED Course  Procedures (including critical care time) Labs Review Labs Reviewed  COMPREHENSIVE METABOLIC PANEL - Abnormal; Notable for the following:    ALT 11 (*)    All other components within normal limits  CBC - Abnormal; Notable for  the following:    WBC 11.8 (*)    MCH 25.4 (*)    All other components within normal limits  LIPASE, BLOOD  URINALYSIS, ROUTINE W REFLEX MICROSCOPIC (NOT AT [facility])  I-STAT BETA HCG BLOOD, ED ([facility], [facility], [facility] ONLY)    Imaging Review US Abdomen Limited Ruq  11/25/2015  CLINICAL DATA:  One day history of right upper quadrant pain with vomiting EXAM: US ABDOMEN LIMITED - RIGHT UPPER QUADRANT COMPARISON:  CT abdomen and pelvis May 20, 2012 FINDINGS: Gallbladder: There is sludge with small calculi in the gallbladder. There is a 7 mm calculus in the neck of the gallbladder which does not move. There is no  gallbladder wall thickening or pericholecystic fluid. No sonographic Murphy sign noted by sonographer. Common bile duct: Diameter: 4 mm. There is no intrahepatic or extrahepatic biliary duct dilatation. Liver: No focal lesion identified. Within normal limits in parenchymal echogenicity. IMPRESSION: 7 mm calculus in the gallbladder adherent in the neck. Smaller calculi and sludge noted within the gallbladder. No gallbladder wall thickening or pericholecystic fluid noted. Study otherwise unremarkable. Electronically Signed   By: Bretta Bang III M.D.   On: 11/25/2015 12:15   I have personally reviewed and evaluated these images and lab results as part of my medical decision-making.   EKG Interpretation None      MDM   Final diagnoses:  RUQ abdominal pain   Patient with a history of Gallstones presents today with RUQ abdominal pain and two episodes of vomiting.  On exam, she does have RUQ abdominal pain.  Labs today are unremarkable, aside from mild leukocytosis.  Ultrasound showing Cholelithiasis, but no evidence of Cholecystitis.  Nausea and pain improved in the ED.  Patient able to tolerate PO liquids.  Feel that the patient is stable for discharge.  Return precautions given.    Santiago Glad, PA-C 11/27/15 1610  Lavera Guise, MD 11/28/15 [phone]

## 2016-07-21 ENCOUNTER — Emergency Department (HOSPITAL_COMMUNITY)
Admission: EM | Admit: 2016-07-21 | Discharge: 2016-07-21 | Disposition: A | Discharge status: Home / Self Care | Payer: Self-pay | Attending: Emergency Medicine | Admitting: Emergency Medicine

## 2016-07-21 ENCOUNTER — Encounter (HOSPITAL_COMMUNITY): Payer: Self-pay | Admitting: Emergency Medicine

## 2016-07-21 ENCOUNTER — Emergency Department (HOSPITAL_COMMUNITY): Payer: Self-pay

## 2016-07-21 DIAGNOSIS — Z3A01 Less than 8 weeks gestation of pregnancy: Secondary | ICD-10-CM | POA: Insufficient documentation

## 2016-07-21 DIAGNOSIS — R102 Pelvic and perineal pain: Secondary | ICD-10-CM | POA: Insufficient documentation

## 2016-07-21 DIAGNOSIS — O208 Other hemorrhage in early pregnancy: Secondary | ICD-10-CM | POA: Insufficient documentation

## 2016-07-21 DIAGNOSIS — O2341 Unspecified infection of urinary tract in pregnancy, first trimester: Secondary | ICD-10-CM | POA: Insufficient documentation

## 2016-07-21 DIAGNOSIS — Z79899 Other long term (current) drug therapy: Secondary | ICD-10-CM | POA: Insufficient documentation

## 2016-07-21 DIAGNOSIS — O26891 Other specified pregnancy related conditions, first trimester: Secondary | ICD-10-CM | POA: Insufficient documentation

## 2016-07-21 DIAGNOSIS — Z3491 Encounter for supervision of normal pregnancy, unspecified, first trimester: Secondary | ICD-10-CM

## 2016-07-21 DIAGNOSIS — N939 Abnormal uterine and vaginal bleeding, unspecified: Secondary | ICD-10-CM

## 2016-07-21 DIAGNOSIS — N390 Urinary tract infection, site not specified: Secondary | ICD-10-CM

## 2016-07-21 LAB — URINALYSIS, ROUTINE W REFLEX MICROSCOPIC
BILIRUBIN URINE: NEGATIVE
Glucose, UA: NEGATIVE mg/dL
HGB URINE DIPSTICK: NEGATIVE
Ketones, ur: 5 mg/dL — AB
NITRITE: POSITIVE — AB
PH: 5 (ref 5.0–8.0)
Protein, ur: NEGATIVE mg/dL
SPECIFIC GRAVITY, URINE: 1.024 (ref 1.005–1.030)

## 2016-07-21 LAB — WET PREP, GENITAL
Clue Cells Wet Prep HPF POC: NONE SEEN
SPERM: NONE SEEN
Trich, Wet Prep: NONE SEEN
Yeast Wet Prep HPF POC: NONE SEEN

## 2016-07-21 LAB — HCG, QUANTITATIVE, PREGNANCY: hCG, Beta Chain, Quant, S: 5543 m[IU]/mL — ABNORMAL HIGH (ref <5)

## 2016-07-21 LAB — POC URINE PREG, ED: PREG TEST UR: POSITIVE — AB

## 2016-07-21 MED ORDER — FOSFOMYCIN TROMETHAMINE 3 G PO PACK
3.0000 g | PACK | Freq: Once | ORAL | Status: AC
  Administered 2016-07-21: 3 g via ORAL
  Filled 2016-07-21: qty 3

## 2016-07-21 NOTE — ED Notes (Signed)
Patient transported to Ultrasound 

## 2016-07-21 NOTE — ED Notes (Signed)
PELVIC READY

## 2016-07-21 NOTE — ED Provider Notes (Signed)
[facility]-EMERGENCY DEPT Provider Note   CSN: [id number] Arrival date & time: 07/21/16  1346     History   Chief Complaint Chief Complaint  Patient presents with  . Nausea    HPI Kathleen Rice is a 26 y.o. female.  26 yo F gravida 1 para 0 with a chief complaint of spotting and pelvic cramping. Going on for the past couple days. Patient took a couple home pregnancy tests that were positive. Also has been constipated for the past 3 or 4 days. Then trying to have bowel movements but having significant tenderness with it. She is unsure if the cramping is from her constipation and her from her pelvic region. Denies dysuria denies increased frequency residency. Denies flank pain. Denies fevers. Describes the bleeding as redness on the toilet paper after she wipes.   The history is provided by the patient.  Illness  This is a new problem. The current episode started yesterday. The problem occurs constantly. The problem has not changed since onset.Pertinent negatives include no chest pain, no headaches and no shortness of breath. Nothing aggravates the symptoms. Nothing relieves the symptoms. She has tried nothing for the symptoms. The treatment provided no relief.    Past Medical History:  Diagnosis Date  . Peripheral neuropathy (HCC)     There are no active problems to display for this patient.   Past Surgical History:  Procedure Laterality Date  . CHOLECYSTECTOMY      OB History    No data available       Home Medications    Prior to Admission medications   Medication Sig Start Date End Date Taking? Authorizing Provider  acetaminophen (TYLENOL) 500 MG tablet Take 500 mg by mouth every 6 (six) hours as needed for mild pain or moderate pain.   Yes Historical Provider, MD  Prenatal Vit-Fe Fumarate-FA (MULTIVITAMIN-PRENATAL) 27-0.8 MG TABS tablet Take 1 tablet by mouth daily at 12 noon.   Yes Historical Provider, MD  guaiFENesin-codeine 100-10 MG/5ML syrup Take 10 mLs by  mouth 3 (three) times daily as needed. Patient not taking: Reported on 11/25/2015 07/11/15   Chinita Pester, FNP  HYDROcodone-acetaminophen (NORCO/VICODIN) 5-325 MG tablet Take 1-2 tablets by mouth every 6 (six) hours as needed. Patient not taking: Reported on 07/21/2016 11/25/15   Santiago Glad, PA-C  ondansetron (ZOFRAN) 4 MG tablet Take 1 tablet (4 mg total) by mouth every 6 (six) hours. Patient not taking: Reported on 07/21/2016 11/25/15   Santiago Glad, PA-C  predniSONE (STERAPRED UNI-PAK 21 TAB) 10 MG (21) TBPK tablet Take 6 tablets on day 1 Take 5 tablets on day 2 Take 4 tablets on day 3 Take 3 tablets on day 4 Take 2 tablets on day 5 Take 1 tablet on day 6 Patient not taking: Reported on 11/25/2015 07/11/15   Chinita Pester, FNP    Family History No family history on file.  Social History Social History  Substance Use Topics  . Smoking status: Never Smoker  . Smokeless tobacco: Never Used  . Alcohol use No     Allergies   Patient has no known allergies.   Review of Systems Review of Systems  Constitutional: Negative for chills and fever.  HENT: Negative for congestion and rhinorrhea.   Eyes: Negative for redness and visual disturbance.  Respiratory: Negative for shortness of breath and wheezing.   Cardiovascular: Negative for chest pain and palpitations.  Gastrointestinal: Positive for constipation, nausea and vomiting.  Genitourinary: Positive for pelvic pain and vaginal  bleeding. Negative for dysuria and urgency.  Musculoskeletal: Negative for arthralgias and myalgias.  Skin: Negative for pallor and wound.  Neurological: Negative for dizziness and headaches.     Physical Exam Updated Vital Signs BP 117/62 (BP Location: Left Arm)   Pulse 76   Temp 98.5 F (36.9 C) (Oral)   Resp 16   Ht 5\' 3"  (1.6 m)   Wt 143 lb (64.9 kg)   LMP 06/13/2016   SpO2 100%   BMI 25.33 kg/m   Physical Exam  Constitutional: She is oriented to person, place, and time. She  appears well-developed and well-nourished. No distress.  HENT:  Head: Normocephalic and atraumatic.  Eyes: EOM are normal. Pupils are equal, round, and reactive to light.  Neck: Normal range of motion. Neck supple.  Cardiovascular: Normal rate and regular rhythm.  Exam reveals no gallop and no friction rub.   No murmur heard. Pulmonary/Chest: Effort normal. She has no wheezes. She has no rales.  Abdominal: Soft. She exhibits no distension and no mass. There is no tenderness. There is no guarding.  Musculoskeletal: She exhibits no edema or tenderness.  Neurological: She is alert and oriented to person, place, and time.  Skin: Skin is warm and dry. She is not diaphoretic.  Psychiatric: She has a normal mood and affect. Her behavior is normal.  Nursing note and vitals reviewed.    ED Treatments / Results  Labs (all labs ordered are listed, but only abnormal results are displayed) Labs Reviewed  WET PREP, GENITAL - Abnormal; Notable for the following:       Result Value   WBC, Wet Prep HPF POC FEW (*)    All other components within normal limits  URINALYSIS, ROUTINE W REFLEX MICROSCOPIC - Abnormal; Notable for the following:    Color, Urine AMBER (*)    APPearance CLOUDY (*)    Ketones, ur 5 (*)    Nitrite POSITIVE (*)    Leukocytes, UA SMALL (*)    Bacteria, UA MANY (*)    Squamous Epithelial / LPF 6-30 (*)    All other components within normal limits  HCG, QUANTITATIVE, PREGNANCY - Abnormal; Notable for the following:    hCG, Beta Chain, Quant, S 5,543 (*)    All other components within normal limits  POC URINE PREG, ED - Abnormal; Notable for the following:    Preg Test, Ur POSITIVE (*)    All other components within normal limits  RPR  HIV ANTIBODY (ROUTINE TESTING)  GC/CHLAMYDIA PROBE AMP [facility]    EKG  EKG Interpretation None       Radiology [place] Ob Comp < 14 Wks  Result Date: 07/21/2016 CLINICAL DATA:  26 year old female with vaginal bleeding  and cramping for 5 days and positive urine pregnancy test. Initial encounter. EXAM: OBSTETRIC <14 WK US AND TRANSVAGINAL OB US TECHNIQUE: Both transabdominal and transvaginal ultrasound examinations were performed for complete evaluation of the gestation as well as the maternal uterus, adnexal regions, and pelvic cul-de-sac. Transvaginal technique was performed to assess early pregnancy. COMPARISON:  Pelvis ultrasound 01/01/2013 FINDINGS: Intrauterine gestational sac: Single Yolk sac:  Not visible Embryo:  Not visible Cardiac Activity: Not detected Heart Rate: Not applicable  bpm MSD: 3.3  mm   5 w   0  d Subchorionic hemorrhage:  None visualized. Maternal uterus/adnexae: No pelvic free fluid. The right ovary measures 3.6 x 2.4 by 2.1 cm. There is a mildly complex cyst within the right ovary measuring 1.8  cm without hypervascularity (image 66). The left ovary measures 2.8 x 2.0 by 2.0 cm and also contains a mildly complex area measuring 1.5 cm without hypervascularity (image 85). IMPRESSION: Probable early intrauterine gestational sac, but no yolk sac, fetal pole, or cardiac activity yet visualized. Recommend follow-up quantitative B-HCG levels and follow-up US in 14 days to assess viability. This recommendation follows SRU consensus guidelines: Diagnostic Criteria for Nonviable Pregnancy Early in the First Trimester. Malva Limes Med 2013; 161:0960-45. Electronically Signed   By: Odessa Fleming M.D.   On: 07/21/2016 15:31   US Ob Transvaginal  Result Date: 07/21/2016 CLINICAL DATA:  26 year old female with vaginal bleeding and cramping for 5 days and positive urine pregnancy test. Initial encounter. EXAM: OBSTETRIC <14 WK [place] AND TRANSVAGINAL OB US TECHNIQUE: Both transabdominal and transvaginal ultrasound examinations were performed for complete evaluation of the gestation as well as the maternal uterus, adnexal regions, and pelvic cul-de-sac. Transvaginal technique was performed to assess early pregnancy. COMPARISON:   Pelvis ultrasound 01/01/2013 FINDINGS: Intrauterine gestational sac: Single Yolk sac:  Not visible Embryo:  Not visible Cardiac Activity: Not detected Heart Rate: Not applicable  bpm MSD: 3.3  mm   5 w   0  d Subchorionic hemorrhage:  None visualized. Maternal uterus/adnexae: No pelvic free fluid. The right ovary measures 3.6 x 2.4 by 2.1 cm. There is a mildly complex cyst within the right ovary measuring 1.8 cm without hypervascularity (image 66). The left ovary measures 2.8 x 2.0 by 2.0 cm and also contains a mildly complex area measuring 1.5 cm without hypervascularity (image 85). IMPRESSION: Probable early intrauterine gestational sac, but no yolk sac, fetal pole, or cardiac activity yet visualized. Recommend follow-up quantitative B-HCG levels and follow-up US in 14 days to assess viability. This recommendation follows SRU consensus guidelines: Diagnostic Criteria for Nonviable Pregnancy Early in the First Trimester. Malva Limes Med 2013; 409:8119-14. Electronically Signed   By: Odessa Fleming M.D.   On: 07/21/2016 15:31    Procedures Procedures (including critical care time) EMERGENCY DEPARTMENT [place] PREGNANCY "Study: Limited Ultrasound of the Pelvis"  INDICATIONS:Pregnancy(required) and Vaginal bleeding Multiple views of the uterus and pelvic cavity are obtained with a multi-frequency probe.  APPROACH:Transabdominal   PERFORMED BY: Myself  IMAGES ARCHIVED?: Yes  LIMITATIONS: Decompressed bladder  PREGNANCY FREE FLUID: Present  PREGNANCY UTERUS FINDINGS:Uterus enlarged and Gestational sac noted ADNEXAL FINDINGS:Left ovary not seen and Right ovary not seen  PREGNANCY FINDINGS: Intrauterine gestational sac noted and No yolk sac noted  INTERPRETATION: Intrauterine gestational sac noted  GESTATIONAL AGE, ESTIMATE: none  Medications Ordered in ED Medications  fosfomycin (MONUROL) packet 3 g (3 g Oral Given 07/21/16 1628)     Initial Impression / Assessment and Plan / ED Course  I have  reviewed the triage vital signs and the nursing notes.  Pertinent labs & imaging results that were available during my care of the patient were reviewed by me and considered in my medical decision making (see chart for details).     67 yoF With a chief complaint of pelvic pain and spotting after a positive home pregnancy test. Bedside ultrasound with likely gestational sac but no other noted findings. Sent for formal ultrasound. Patient with left adnexal tenderness on exam. That with no definitive location of pregnancy will have her return in 48 hours her see OB for repeat Quant and ultrasound.  12:11 AM:  I have discussed the diagnosis/risks/treatment options with the patient and family and believe the pt to be  eligible for discharge home to follow-up with OBGYN. We also discussed returning to the ED immediately if new or worsening sx occur. We discussed the sx which are most concerning (e.g., sudden worsening pain, fever, inability to tolerate by mouth, sudden worsening vaginal bleeding) that necessitate immediate return. Medications administered to the patient during their visit and any new prescriptions provided to the patient are listed below.  Medications given during this visit Medications  fosfomycin (MONUROL) packet 3 g (3 g Oral Given 07/21/16 1628)     The patient appears reasonably screen and/or stabilized for discharge and I doubt any other medical condition or other [facility] requiring further screening, evaluation, or treatment in the ED at this time prior to discharge.      Final Clinical Impressions(s) / ED Diagnoses   Final diagnoses:  Vaginal bleeding  Lower urinary tract infectious disease  First trimester pregnancy    New Prescriptions Discharge Medication List as of 07/21/2016  4:07 PM       Melene Plan, DO 07/22/16 0011

## 2016-07-21 NOTE — ED Notes (Signed)
ED Provider at bedside. 

## 2016-07-21 NOTE — ED Triage Notes (Addendum)
Patient states she took 2 pregnancy tests and they read positive. Patient states she has 1 missed menstrual period. Patient is complaining of lower abdominal pain/nausea/2 episodes of emesis/ constipation and noticed blood when wiping after urination x 4-5 days.

## 2016-07-21 NOTE — Discharge Instructions (Signed)
Follow up with OB in 48 hours for repeat US.  Return for sudden worsening pain, vaginal bleeding.

## 2016-07-22 LAB — RPR: RPR Ser Ql: NONREACTIVE

## 2016-07-22 LAB — HIV ANTIBODY (ROUTINE TESTING W REFLEX): HIV Screen 4th Generation wRfx: NONREACTIVE

## 2016-07-22 LAB — GC/CHLAMYDIA PROBE AMP (~~LOC~~) NOT AT ARMC
CHLAMYDIA, DNA PROBE: NEGATIVE
Neisseria Gonorrhea: NEGATIVE

## 2016-07-28 ENCOUNTER — Encounter (HOSPITAL_COMMUNITY): Payer: Self-pay | Admitting: Emergency Medicine

## 2016-07-28 ENCOUNTER — Emergency Department (HOSPITAL_COMMUNITY)
Admission: EM | Admit: 2016-07-28 | Discharge: 2016-07-29 | Disposition: A | Discharge status: Home / Self Care | Payer: Self-pay | Attending: Emergency Medicine | Admitting: Emergency Medicine

## 2016-07-28 DIAGNOSIS — R58 Hemorrhage, not elsewhere classified: Secondary | ICD-10-CM

## 2016-07-28 DIAGNOSIS — Z3A01 Less than 8 weeks gestation of pregnancy: Secondary | ICD-10-CM | POA: Insufficient documentation

## 2016-07-28 DIAGNOSIS — O209 Hemorrhage in early pregnancy, unspecified: Secondary | ICD-10-CM | POA: Insufficient documentation

## 2016-07-28 DIAGNOSIS — O210 Mild hyperemesis gravidarum: Secondary | ICD-10-CM | POA: Insufficient documentation

## 2016-07-28 DIAGNOSIS — Z09 Encounter for follow-up examination after completed treatment for conditions other than malignant neoplasm: Secondary | ICD-10-CM

## 2016-07-28 DIAGNOSIS — Z3491 Encounter for supervision of normal pregnancy, unspecified, first trimester: Secondary | ICD-10-CM

## 2016-07-28 MED ORDER — SODIUM CHLORIDE 0.9 % IV BOLUS (SEPSIS)
1000.0000 mL | Freq: Once | INTRAVENOUS | Status: AC
  Administered 2016-07-29: 1000 mL via INTRAVENOUS

## 2016-07-28 MED ORDER — ONDANSETRON 4 MG PO TBDP
4.0000 mg | ORAL_TABLET | Freq: Once | ORAL | Status: AC
  Administered 2016-07-28: 4 mg via ORAL
  Filled 2016-07-28: qty 1

## 2016-07-28 NOTE — ED Provider Notes (Signed)
[facility]-EMERGENCY DEPT Provider Note   CSN: [id number] Arrival date & time: 07/28/16  2010  By signing my name below, I, Modena Jansky, attest that this documentation has been prepared under the direction and in the presence of Shon Baton, MD. Electronically Signed: Modena Jansky, Scribe. 07/28/2016. 11:56 PM.  History   Chief Complaint Chief Complaint  Patient presents with  . Emesis   The history is provided by the patient. No language interpreter was used.   HPI Comments: Kathleen Rice is a 26 y.o. female who presents to the Emergency Department complaining of intermittent vomiting that started about a week ago. She is G1P0.  She states she is currently [redacted] weeks pregnant and her LMNP was on January 2th, 2018. She recently had an ultrasound done, but they were unable to visualize fetus due to pregnancy timeline. She has a prenatal appointment next month. She tried herbal remedies without any relief. She cannot keep down solids. She reports associated dizziness with standing. Her last episode of bleeding was a week ago. She denies any abdominal pain, current vaginal bleeding, or other complaints.   Past Medical History:  Diagnosis Date  . Peripheral neuropathy (HCC)     There are no active problems to display for this patient.   Past Surgical History:  Procedure Laterality Date  . CHOLECYSTECTOMY      OB History    Gravida Para Term Preterm AB Living   1             SAB TAB Ectopic Multiple Live Births                   Home Medications    Prior to Admission medications   Medication Sig Start Date End Date Taking? Authorizing Provider  acetaminophen (TYLENOL) 500 MG tablet Take 500 mg by mouth every 6 (six) hours as needed for mild pain or moderate pain.   Yes Historical Provider, MD  Prenatal Vit-Fe Fumarate-FA (MULTIVITAMIN-PRENATAL) 27-0.8 MG TABS tablet Take 1 tablet by mouth daily at 12 noon.   Yes Historical Provider, MD  Doxylamine-Pyridoxine (DICLEGIS)  10-10 MG TBEC Take one tablet by mouth at bedtime. You may redose 1-2 tablets in the morning if her nausea persists. 07/29/16   Shon Baton, MD  guaiFENesin-codeine 100-10 MG/5ML syrup Take 10 mLs by mouth 3 (three) times daily as needed. Patient not taking: Reported on 11/25/2015 07/11/15   Chinita Pester, FNP  HYDROcodone-acetaminophen (NORCO/VICODIN) 5-325 MG tablet Take 1-2 tablets by mouth every 6 (six) hours as needed. Patient not taking: Reported on 07/21/2016 11/25/15   Santiago Glad, PA-C  ondansetron (ZOFRAN) 4 MG tablet Take 1 tablet (4 mg total) by mouth every 6 (six) hours. Patient not taking: Reported on 07/21/2016 11/25/15   Santiago Glad, PA-C  predniSONE (STERAPRED UNI-PAK 21 TAB) 10 MG (21) TBPK tablet Take 6 tablets on day 1 Take 5 tablets on day 2 Take 4 tablets on day 3 Take 3 tablets on day 4 Take 2 tablets on day 5 Take 1 tablet on day 6 Patient not taking: Reported on 11/25/2015 07/11/15   Chinita Pester, FNP    Family History History reviewed. No pertinent family history.  Social History Social History  Substance Use Topics  . Smoking status: Never Smoker  . Smokeless tobacco: Never Used  . Alcohol use No     Allergies   Patient has no known allergies.   Review of Systems Review of Systems  Constitutional: Negative for fever.  Gastrointestinal: Positive for nausea and vomiting. Negative for abdominal pain.  Genitourinary: Negative for vaginal bleeding.  Neurological: Positive for dizziness.  All other systems reviewed and are negative.    Physical Exam Updated Vital Signs BP 111/66 (BP Location: Right Arm)   Pulse 79   Temp 98.1 F (36.7 C) (Oral)   Resp 17   Ht 5\' 3"  (1.6 m)   Wt 145 lb (65.8 kg)   LMP 06/15/2016   SpO2 99%   BMI 25.69 kg/m   Physical Exam  Constitutional: She is oriented to person, place, and time. She appears well-developed and well-nourished.  HENT:  Head: Normocephalic and atraumatic.  Cardiovascular: Normal  rate, regular rhythm and normal heart sounds.   Pulmonary/Chest: Effort normal and breath sounds normal. No respiratory distress. She has no wheezes.  Abdominal: Soft. Bowel sounds are normal. There is no tenderness. There is no guarding.  Neurological: She is alert and oriented to person, place, and time.  Skin: Skin is warm and dry.  Psychiatric: She has a normal mood and affect.  Nursing note and vitals reviewed.    ED Treatments / Results  DIAGNOSTIC STUDIES: Oxygen Saturation is 99% on RA, normal by my interpretation.    COORDINATION OF CARE: 12:00 AM- Pt advised of plan for treatment and pt agrees.  Labs (all labs ordered are listed, but only abnormal results are displayed) Labs Reviewed  CBC WITH DIFFERENTIAL/PLATELET - Abnormal; Notable for the following:       Result Value   Hemoglobin 11.0 (*)    HCT 33.3 (*)    All other components within normal limits  I-STAT BETA HCG BLOOD, ED ([facility], [facility], [facility] ONLY) - Abnormal; Notable for the following:    I-stat hCG, quantitative >2,000.0 (*)    All other components within normal limits  BASIC METABOLIC PANEL    EKG  EKG Interpretation None       Radiology US Ob Transvaginal  Result Date: 07/29/2016 CLINICAL DATA:  Vaginal bleeding 1 week ago which has stopped. Could not confirm intrauterine pregnancy at that time. Follow-up. EXAM: TRANSVAGINAL OB ULTRASOUND TECHNIQUE: Transvaginal ultrasound was performed for complete evaluation of the gestation as well as the maternal uterus, adnexal regions, and pelvic cul-de-sac. COMPARISON:  07/21/2016 ultrasound FINDINGS: Intrauterine gestational sac: Single Yolk sac:  Visualized. Embryo:  Visualized. Cardiac Activity: Visualized. Heart Rate: 108 bpm CRL:   2.6  mm   5 w 6 d                  [place] EDC: 03/25/2017 Subchorionic hemorrhage:  None visualized. Maternal uterus/adnexae: Corpus luteum on the right. Normal left ovary. IMPRESSION: Single live intrauterine gestation at 5 weeks 6 days.  Ultrasound [facility] 03/25/2017. Electronically Signed   By: Tollie Eth M.D.   On: 07/29/2016 02:10    Procedures Procedures (including critical care time)  Medications Ordered in ED Medications  ondansetron (ZOFRAN-ODT) disintegrating tablet 4 mg (4 mg Oral Given 07/28/16 2110)  sodium chloride 0.9 % bolus 1,000 mL (0 mLs Intravenous Stopped 07/29/16 0200)  promethazine (PHENERGAN) injection 12.5 mg (12.5 mg Intravenous Given 07/29/16 0021)     Initial Impression / Assessment and Plan / ED Course  I have reviewed the triage vital signs and the nursing notes.  Pertinent labs & imaging results that were available during my care of the patient were reviewed by me and considered in my medical decision making (see chart for details).    Patient presents with nausea, vomiting, dizziness in  the first trimester pregnancy. She reports bleeding last week that has resolved. At that time she had an inconclusive ultrasound because it was too early in her pregnancy. She is currently nontoxic. Vital signs reassuring. Patient was given fluids and nausea medication. Lab work obtained and reassuring. Ultrasound obtained given prior bleeding an inconclusive ultrasound. Ultrasound shows a 5 week 6 day intrauterine pregnancy. Patient states the bleeding has resolved. Pelvic exam deferred. On recheck, patient states she feels much better. She is tolerating fluids. She will be given Diclegus. Follow-up with OB. Return precautions were given.  After history, exam, and medical workup I feel the patient has been appropriately medically screened and is safe for discharge home. Pertinent diagnoses were discussed with the patient. Patient was given return precautions.  Final Clinical Impressions(s) / ED Diagnoses   Final diagnoses:  Bleeding  First trimester pregnancy  Morning sickness    New Prescriptions New Prescriptions   DOXYLAMINE-PYRIDOXINE (DICLEGIS) 10-10 MG TBEC    Take one tablet by mouth at bedtime. You  may redose 1-2 tablets in the morning if her nausea persists.   I personally performed the services described in this documentation, which was scribed in my presence. The recorded information has been reviewed and is accurate.     Shon Batonourtney F Luccia Reinheimer, MD 07/29/16 [phone]

## 2016-07-28 NOTE — ED Triage Notes (Signed)
Pt states she is [redacted] weeks pregnant and is having uncontrollable nausea and vomiting  Pt states she is dizzy every time she stands up  Pt states this started about a week ago  Pt has not had prenatal care but has been taking OTC prenatal gummies and has an appointment on March 5th

## 2016-07-29 ENCOUNTER — Emergency Department (HOSPITAL_COMMUNITY): Payer: Self-pay

## 2016-07-29 LAB — CBC WITH DIFFERENTIAL/PLATELET
BASOS ABS: 0 10*3/uL (ref 0.0–0.1)
BASOS PCT: 0 %
EOS ABS: 0.2 10*3/uL (ref 0.0–0.7)
EOS PCT: 2 %
HCT: 33.3 % — ABNORMAL LOW (ref 36.0–46.0)
Hemoglobin: 11 g/dL — ABNORMAL LOW (ref 12.0–15.0)
Lymphocytes Relative: 34 %
Lymphs Abs: 2.9 10*3/uL (ref 0.7–4.0)
MCH: 26.1 pg (ref 26.0–34.0)
MCHC: 33 g/dL (ref 30.0–36.0)
MCV: 78.9 fL (ref 78.0–100.0)
MONOS PCT: 11 %
Monocytes Absolute: 0.9 10*3/uL (ref 0.1–1.0)
Neutro Abs: 4.4 10*3/uL (ref 1.7–7.7)
Neutrophils Relative %: 53 %
Platelets: 259 10*3/uL (ref 150–400)
RBC: 4.22 MIL/uL (ref 3.87–5.11)
RDW: 13.3 % (ref 11.5–15.5)
WBC: 8.4 10*3/uL (ref 4.0–10.5)

## 2016-07-29 LAB — BASIC METABOLIC PANEL
Anion gap: 8 (ref 5–15)
BUN: 16 mg/dL (ref 6–20)
CALCIUM: 9.3 mg/dL (ref 8.9–10.3)
CO2: 25 mmol/L (ref 22–32)
CREATININE: 0.66 mg/dL (ref 0.44–1.00)
Chloride: 104 mmol/L (ref 101–111)
Glucose, Bld: 99 mg/dL (ref 65–99)
Potassium: 4.3 mmol/L (ref 3.5–5.1)
SODIUM: 137 mmol/L (ref 135–145)

## 2016-07-29 LAB — I-STAT BETA HCG BLOOD, ED (MC, WL, AP ONLY)

## 2016-07-29 MED ORDER — DOXYLAMINE-PYRIDOXINE 10-10 MG PO TBEC: DELAYED_RELEASE_TABLET | ORAL | 0 refills | Status: DC

## 2016-07-29 MED ORDER — PROMETHAZINE HCL 25 MG/ML IJ SOLN
12.5000 mg | Freq: Once | INTRAMUSCULAR | Status: AC
  Administered 2016-07-29: 12.5 mg via INTRAVENOUS
  Filled 2016-07-29: qty 1

## 2016-07-29 NOTE — ED Notes (Signed)
Patient has not vomited

## 2016-07-29 NOTE — ED Notes (Signed)
Pt given sprite to drink. 

## 2016-07-29 NOTE — Discharge Instructions (Signed)
You were seen today for morning sickness. Your ultrasound shows a 5 week 6 day intrauterine pregnancy.  If you develop pain, bleeding, or any new or worsening symptoms she needs to be evaluated by her OB immediately.

## 2016-07-29 NOTE — ED Notes (Signed)
US at bedside

## 2016-08-02 ENCOUNTER — Telehealth: Payer: Self-pay | Admitting: Surgery

## 2016-08-02 NOTE — Telephone Encounter (Signed)
ED CM received call concerning patient not being able to afford medication prescribed in the ED. Patient states,, her insurance has been terminated. CM discussed MATCH program and it's guidelines, patient is agreeable to terms of MATCH Program including $3 co-pay. [facility] and arrangements made to pick up letter at [facility]. Patient verbalized understanding and teach back done. No further CM needs identified

## 2016-08-10 ENCOUNTER — Emergency Department (HOSPITAL_COMMUNITY)
Admission: EM | Admit: 2016-08-10 | Discharge: 2016-08-11 | Disposition: A | Discharge status: Home / Self Care | Attending: Emergency Medicine | Admitting: Emergency Medicine

## 2016-08-10 ENCOUNTER — Encounter (HOSPITAL_COMMUNITY): Payer: Self-pay | Admitting: Emergency Medicine

## 2016-08-10 DIAGNOSIS — O210 Mild hyperemesis gravidarum: Secondary | ICD-10-CM | POA: Diagnosis not present

## 2016-08-10 DIAGNOSIS — K5900 Constipation, unspecified: Secondary | ICD-10-CM | POA: Diagnosis not present

## 2016-08-10 DIAGNOSIS — Z3A01 Less than 8 weeks gestation of pregnancy: Secondary | ICD-10-CM | POA: Insufficient documentation

## 2016-08-10 DIAGNOSIS — O99611 Diseases of the digestive system complicating pregnancy, first trimester: Secondary | ICD-10-CM | POA: Diagnosis not present

## 2016-08-10 DIAGNOSIS — O26811 Pregnancy related exhaustion and fatigue, first trimester: Secondary | ICD-10-CM | POA: Insufficient documentation

## 2016-08-10 DIAGNOSIS — O219 Vomiting of pregnancy, unspecified: Secondary | ICD-10-CM

## 2016-08-10 DIAGNOSIS — R42 Dizziness and giddiness: Secondary | ICD-10-CM

## 2016-08-10 LAB — URINALYSIS, ROUTINE W REFLEX MICROSCOPIC
BILIRUBIN URINE: NEGATIVE
Glucose, UA: NEGATIVE mg/dL
HGB URINE DIPSTICK: NEGATIVE
KETONES UR: NEGATIVE mg/dL
Leukocytes, UA: NEGATIVE
Nitrite: NEGATIVE
PROTEIN: NEGATIVE mg/dL
Specific Gravity, Urine: 1.027 (ref 1.005–1.030)
pH: 5 (ref 5.0–8.0)

## 2016-08-10 LAB — CBC
HCT: 33.1 % — ABNORMAL LOW (ref 36.0–46.0)
Hemoglobin: 11.1 g/dL — ABNORMAL LOW (ref 12.0–15.0)
MCH: 26.2 pg (ref 26.0–34.0)
MCHC: 33.5 g/dL (ref 30.0–36.0)
MCV: 78.1 fL (ref 78.0–100.0)
PLATELETS: 265 10*3/uL (ref 150–400)
RBC: 4.24 MIL/uL (ref 3.87–5.11)
RDW: 13.1 % (ref 11.5–15.5)
WBC: 10.6 10*3/uL — AB (ref 4.0–10.5)

## 2016-08-10 LAB — COMPREHENSIVE METABOLIC PANEL
ALBUMIN: 4.2 g/dL (ref 3.5–5.0)
ALK PHOS: 41 U/L (ref 38–126)
ALT: 12 U/L — ABNORMAL LOW (ref 14–54)
AST: 19 U/L (ref 15–41)
Anion gap: 7 (ref 5–15)
BUN: 13 mg/dL (ref 6–20)
CHLORIDE: 105 mmol/L (ref 101–111)
CO2: 24 mmol/L (ref 22–32)
CREATININE: 0.49 mg/dL (ref 0.44–1.00)
Calcium: 9.5 mg/dL (ref 8.9–10.3)
GFR calc Af Amer: >60 mL/min (ref >60)
GFR calc non Af Amer: >60 mL/min (ref >60)
Glucose, Bld: 83 mg/dL (ref 65–99)
Potassium: 3.5 mmol/L (ref 3.5–5.1)
SODIUM: 136 mmol/L (ref 135–145)
Total Protein: 7.3 g/dL (ref 6.5–8.1)

## 2016-08-10 LAB — LIPASE, BLOOD: Lipase: 27 U/L (ref 11–51)

## 2016-08-10 MED ORDER — ONDANSETRON 4 MG PO TBDP
4.0000 mg | ORAL_TABLET | Freq: Once | ORAL | Status: AC | PRN
  Administered 2016-08-10: 4 mg via ORAL
  Filled 2016-08-10: qty 1

## 2016-08-10 MED ORDER — SODIUM CHLORIDE 0.9 % IV BOLUS (SEPSIS)
2000.0000 mL | Freq: Once | INTRAVENOUS | Status: AC
  Administered 2016-08-11: 2000 mL via INTRAVENOUS

## 2016-08-10 NOTE — ED Provider Notes (Signed)
[facility]-EMERGENCY DEPT Provider Note   CSN: [id number] Arrival date & time: 08/10/16  1740  By signing my name below, I, Kathleen Rice, attest that this documentation has been prepared under the direction and in the presence of [address] [employer]. Electronically Signed: Rosario Rice, ED Scribe. 08/10/16. 10:35 PM.  History   Chief Complaint Chief Complaint  Patient presents with  . Emesis During Pregnancy   The history is provided by the patient and medical records. No language interpreter was used.  Emesis   This is a recurrent problem. The current episode started more than 1 week ago. The problem occurs continuously. The problem has not changed since onset.The emesis has an appearance of stomach contents. There has been no fever. Pertinent negatives include no abdominal pain, no arthralgias, no chills, no cough, no diarrhea, no fever, no myalgias and no URI.    HPI Comments: Kathleen Rice is a G10P000 26 y.o. female who is currently [redacted]w[redacted]d pregnant, who presents to the Emergency Department complaining of persistent, gradually worsening nausea and non-bloody, non-bilious vomiting beginning five weeks ago. She has been having persistent vomiting since getting pregnant, reports she has ~4-5 episodes daily. She also notes that with standing she will become light-headed, intermittently, and only with standing. Pt additionally reports that she has been constipated recently, however, she was able to have a bowel movement today which was normal. She states that her nausea and vomiting is precipitated and exacerbated following any PO intake. She was prescribed Diclegis for this issue with improvement of her symptoms, however, she began tapering off of this and her symptoms worsened again. She has also been attempting to eat crackers and ginger popsicles without relief of her symptoms. Pt was given Zofran while in the ED with improvement of her nausea. Per prior chart review, pt was seen  on 07/28/16 in the ED and had a transvaginal US performed that confirmed that she was [redacted]w[redacted]d pregnant, single IUP with +cardiac activity seen. Labs that visit were unremarkable. No recent travel, suspicious food intake, sick contacts, alcohol use, or NSAID usage. She is currently taking her pre-natal vitamins. Pt has f/up scheduled with an OBGYN in five days. She denies fevers, chills, CP, SOB, abd pain, hematemesis, diarrhea, ongoing constipation, obstipation, melena, hematochezia, hematuria, dysuria, vaginal bleeding/discharge, myalgias, arthralgias, numbness, tingling, focal weakness, or any other complaints at this time.  Past Medical History:  Diagnosis Date  . Peripheral neuropathy (HCC)    There are no active problems to display for this patient.  Past Surgical History:  Procedure Laterality Date  . CHOLECYSTECTOMY     OB History    Gravida Para Term Preterm AB Living   1             SAB TAB Ectopic Multiple Live Births                 Home Medications    Prior to Admission medications   Medication Sig Start Date End Date Taking? Authorizing Provider  acetaminophen (TYLENOL) 500 MG tablet Take 500 mg by mouth every 6 (six) hours as needed for mild pain or moderate pain.   Yes Historical Provider, MD  Doxylamine-Pyridoxine (DICLEGIS) 10-10 MG TBEC Take one tablet by mouth at bedtime. You may redose 1-2 tablets in the morning if her nausea persists. 07/29/16  Yes Shon Baton, MD  Prenatal Vit-Fe Fumarate-FA (MULTIVITAMIN-PRENATAL) 27-0.8 MG TABS tablet Take 1 tablet by mouth daily at 12 noon.   Yes Historical Provider, MD  guaiFENesin-codeine 100-10 MG/5ML syrup Take 10 mLs by mouth 3 (three) times daily as needed. Patient not taking: Reported on 11/25/2015 07/11/15   Chinita Pester, FNP  HYDROcodone-acetaminophen (NORCO/VICODIN) 5-325 MG tablet Take 1-2 tablets by mouth every 6 (six) hours as needed. Patient not taking: Reported on 07/21/2016 11/25/15   Santiago Glad, PA-C    ondansetron (ZOFRAN) 4 MG tablet Take 1 tablet (4 mg total) by mouth every 6 (six) hours. Patient not taking: Reported on 07/21/2016 11/25/15   Santiago Glad, PA-C  predniSONE (STERAPRED UNI-PAK 21 TAB) 10 MG (21) TBPK tablet Take 6 tablets on day 1 Take 5 tablets on day 2 Take 4 tablets on day 3 Take 3 tablets on day 4 Take 2 tablets on day 5 Take 1 tablet on day 6 Patient not taking: Reported on 11/25/2015 07/11/15   Chinita Pester, FNP   Family History No family history on file.  Social History Social History  Substance Use Topics  . Smoking status: Never Smoker  . Smokeless tobacco: Never Used  . Alcohol use No   Allergies   Patient has no known allergies.  Review of Systems Review of Systems  Constitutional: Negative for chills and fever.  Respiratory: Negative for cough and shortness of breath.   Cardiovascular: Negative for chest pain.  Gastrointestinal: Positive for constipation (resolved today), nausea and vomiting. Negative for abdominal pain, blood in stool and diarrhea.  Genitourinary: Negative for dysuria, hematuria, vaginal bleeding and vaginal discharge.  Musculoskeletal: Negative for arthralgias and myalgias.  Skin: Negative for color change.  Allergic/Immunologic: Negative for immunocompromised state.  Neurological: Positive for light-headedness (intermittent, with standing). Negative for weakness and numbness.  Psychiatric/Behavioral: Negative for confusion.   A complete 10 system review of systems was obtained and all systems are negative except as noted in the HPI and PMH.   Physical Exam Updated Vital Signs BP 116/55 (BP Location: Left Arm)   Pulse 68   Temp 99.4 F (37.4 C) (Oral)   Resp 16   Ht 5\' 3"  (1.6 m)   Wt 145 lb 3 oz (65.9 kg)   LMP 06/15/2016   SpO2 100%   BMI 25.72 kg/m   Physical Exam  Constitutional: She is oriented to person, place, and time. Vital signs are normal. She appears well-developed and well-nourished.  Non-toxic  appearance. No distress.  Afebrile, nontoxic, NAD  HENT:  Head: Normocephalic and atraumatic.  Mouth/Throat: Oropharynx is clear and moist and mucous membranes are normal.  Eyes: Conjunctivae and EOM are normal. Right eye exhibits no discharge. Left eye exhibits no discharge.  Neck: Normal range of motion. Neck supple.  Cardiovascular: Normal rate, regular rhythm, normal heart sounds and intact distal pulses.  Exam reveals no gallop and no friction rub.   No murmur heard. Pulmonary/Chest: Effort normal and breath sounds normal. No respiratory distress. She has no decreased breath sounds. She has no wheezes. She has no rhonchi. She has no rales.  Abdominal: Soft. Normal appearance and bowel sounds are normal. She exhibits no distension. There is no tenderness. There is no rigidity, no rebound, no guarding, no CVA tenderness, no tenderness at McBurney's point and negative Murphy's sign.  Soft, NTND, +BS throughout, no r/g/r, neg murphy's, neg mcburney's, no CVA TTP  Musculoskeletal: Normal range of motion.  Neurological: She is alert and oriented to person, place, and time. She has normal strength. No sensory deficit.  Skin: Skin is warm, dry and intact. No rash noted.  Psychiatric: She has a normal mood  and affect.  Nursing note and vitals reviewed.  ED Treatments / Results  DIAGNOSTIC STUDIES: Oxygen Saturation is 100% on RA, normal by my interpretation.   COORDINATION OF CARE: 10:35 PM-Discussed next steps with pt. Pt verbalized understanding and is agreeable with the plan.   Labs (all labs ordered are listed, but only abnormal results are displayed) Labs Reviewed  COMPREHENSIVE METABOLIC PANEL - Abnormal; Notable for the following:       Result Value   ALT 12 (*)    Total Bilirubin <0.1 (*)    All other components within normal limits  CBC - Abnormal; Notable for the following:    WBC 10.6 (*)    Hemoglobin 11.1 (*)    HCT 33.1 (*)    All other components within normal limits   LIPASE, BLOOD  URINALYSIS, ROUTINE W REFLEX MICROSCOPIC   EKG  EKG Interpretation None      Radiology No results found.   07/29/16 TVUS Study Result : CLINICAL DATA:  Vaginal bleeding 1 week ago which has stopped. Could not confirm intrauterine pregnancy at that time. Follow-up.  EXAM: TRANSVAGINAL OB ULTRASOUND  TECHNIQUE: Transvaginal ultrasound was performed for complete evaluation of the gestation as well as the maternal uterus, adnexal regions, and pelvic cul-de-sac.  COMPARISON:  07/21/2016 ultrasound  FINDINGS: Intrauterine gestational sac: Single  Yolk sac:  Visualized.  Embryo:  Visualized.  Cardiac Activity: Visualized.  Heart Rate: 108 bpm  CRL:   2.6  mm   5 w 6 d                  US EDC: 03/25/2017  Subchorionic hemorrhage:  None visualized.  Maternal uterus/adnexae: Corpus luteum on the right. Normal left ovary.  IMPRESSION: Single live intrauterine gestation at 5 weeks 6 days. [facility]   Electronically Signed   By: Tollie Ethavid  Kwon M.D.   On: 07/29/2016 02:10     Procedures Procedures   Medications Ordered in ED Medications  ondansetron (ZOFRAN-ODT) disintegrating tablet 4 mg (4 mg Oral Given 08/10/16 1752)  sodium chloride 0.9 % bolus 2,000 mL (2,000 mLs Intravenous New Bag/Given 08/11/16 0019)   Initial Impression / Assessment and Plan / ED Course  I have reviewed the triage vital signs and the nursing notes.  Pertinent labs & imaging results that were available during my care of the patient were reviewed by me and considered in my medical decision making (see chart for details).     26 y.o. female here with n/v x5 weeks, currently 1112w5d pregnant. Was seen 2wks ago and was give diclegis which helps, but she tapered down and her symptoms returned. Some intermittent lightheadedness with standing as well. No abd pain or vaginal bleeding. TVUS done last visit was unremarkable and demonstrated single live IUP  at 8384w6d at that time. On exam, no abdominal tenderness, well appearing, benign exam. Was given zofran in triage, which helped significantly, and she has already eaten a sandwich. U/A unremarkable, labs today unremarkable aside from mild anemia which is stable from prior visit 2wks ago. Doubt need for further emergent work up, will give 2L fluids to hydrate pt, and likely d/c home with zofran rx, although advised that she should try diclegis first since this is absolutely safe in pregnancy; R/B/A of zofran advised, and pt willing to try this as a back up. Will reassess after fluids finished  2:41 AM Several delays in getting fluids started, and then IV infiltrated so this delayed fluids from being completed; however  fluids ~75% finished, and pt continues to feel improved, no ongoing vomiting, tolerating PO well throughout her ED stay, and feels well enough to go home at this time. Will d/c home with rx for zofran, and diclegis refill. Advised diet modifications for nausea/hyperemesis; discussed staying hydrated, continuing prenatal vitamins. Also discussed fiber and water intake for constipation, and OTC miralax PRN as well. F/up with OBGYN at her appt on Monday. I explained the diagnosis and have given explicit precautions to return to the ER including for any other new or worsening symptoms. The patient understands and accepts the medical plan as it's been dictated and I have answered their questions. Discharge instructions concerning home care and prescriptions have been given. The patient is STABLE and is discharged to home in good condition.   I personally performed the services described in this documentation, which was scribed in my presence. The recorded information has been reviewed and is accurate.   Final Clinical Impressions(s) / ED Diagnoses   Final diagnoses:  Nausea and vomiting during pregnancy prior to [redacted] weeks gestation  Hyperemesis gravidarum  Orthostatic lightheadedness    Constipation, unspecified constipation type   New Prescriptions New Prescriptions   DOXYLAMINE-PYRIDOXINE 10-10 MG TBEC    2 tablets on an empty stomach at bedtime on day 1 and 2. If symptoms persist, take 1 tablet in the morning and 2 tablets at bedtime on day 3. If symptoms persist, increase to 1 tablet in the morning, 1 tablet in the afternoon, and 2 tablets at bedtime daily.   ONDANSETRON (ZOFRAN ODT) 4 MG DISINTEGRATING TABLET    Take 1 tablet (4 mg total) by mouth every 8 (eight) hours as needed for nausea or vomiting.      [address] PA-C 08/11/16 4098    Dione Booze, MD 08/11/16 1450

## 2016-08-10 NOTE — ED Triage Notes (Signed)
Patient c/o n/v and constipation. Patient states that she has an appt this upcoming Monday with OB. patient was seen here on 14th for same symptoms.

## 2016-08-11 MED ORDER — DOXYLAMINE-PYRIDOXINE 10-10 MG PO TBEC: DELAYED_RELEASE_TABLET | ORAL | 0 refills | Status: DC

## 2016-08-11 MED ORDER — ONDANSETRON 4 MG PO TBDP: 4.0000 mg | ORAL_TABLET | Freq: Three times a day (TID) | ORAL | 0 refills | Status: DC | PRN

## 2016-08-11 NOTE — Discharge Instructions (Signed)
Stay well hydrated with plenty of water. Use diclegis as directed as needed for nausea/vomiting, and use zofran as directed as needed for refractory nausea/vomiting. See the list of foods below that can help with nausea/vomiting during pregnancy. Increase fiber and water intake in your diet to help with constipation; may consider over the counter miralax as needed for additional relief of constipation. Follow up with your OBGYN on Monday for your already scheduled appointment, for recheck of symptoms and ongoing management of your pregnancy. [facility] for emergent changes or worsening symptoms.

## 2016-08-24 ENCOUNTER — Inpatient Hospital Stay (HOSPITAL_COMMUNITY)
Admission: AD | Admit: 2016-08-24 | Discharge: 2016-08-25 | Disposition: A | Discharge status: Home / Self Care | Source: Ambulatory Visit | Attending: Family Medicine | Admitting: Family Medicine

## 2016-08-24 ENCOUNTER — Encounter (HOSPITAL_COMMUNITY): Payer: Self-pay

## 2016-08-24 DIAGNOSIS — O210 Mild hyperemesis gravidarum: Secondary | ICD-10-CM

## 2016-08-24 DIAGNOSIS — Z3A00 Weeks of gestation of pregnancy not specified: Secondary | ICD-10-CM | POA: Insufficient documentation

## 2016-08-24 DIAGNOSIS — G629 Polyneuropathy, unspecified: Secondary | ICD-10-CM | POA: Insufficient documentation

## 2016-08-24 DIAGNOSIS — Z79899 Other long term (current) drug therapy: Secondary | ICD-10-CM | POA: Insufficient documentation

## 2016-08-24 DIAGNOSIS — O26899 Other specified pregnancy related conditions, unspecified trimester: Secondary | ICD-10-CM | POA: Diagnosis not present

## 2016-08-24 DIAGNOSIS — R109 Unspecified abdominal pain: Secondary | ICD-10-CM | POA: Diagnosis present

## 2016-08-24 LAB — URINALYSIS, ROUTINE W REFLEX MICROSCOPIC
Bilirubin Urine: NEGATIVE
Glucose, UA: NEGATIVE mg/dL
Hgb urine dipstick: NEGATIVE
KETONES UR: 80 mg/dL — AB
Leukocytes, UA: NEGATIVE
Nitrite: NEGATIVE
PH: 6 (ref 5.0–8.0)
PROTEIN: 100 mg/dL — AB
Specific Gravity, Urine: 1.031 — ABNORMAL HIGH (ref 1.005–1.030)

## 2016-08-24 MED ORDER — LACTATED RINGERS IV SOLN
Freq: Once | INTRAVENOUS | Status: AC
  Administered 2016-08-24: 23:00:00 via INTRAVENOUS
  Filled 2016-08-24: qty 1000

## 2016-08-24 MED ORDER — M.V.I. ADULT IV INJ: Freq: Once | INTRAVENOUS | Status: DC

## 2016-08-24 MED ORDER — PROMETHAZINE HCL 25 MG/ML IJ SOLN
25.0000 mg | Freq: Four times a day (QID) | INTRAMUSCULAR | Status: DC | PRN
  Administered 2016-08-24: 25 mg via INTRAVENOUS
  Filled 2016-08-24: qty 1

## 2016-08-24 MED ORDER — SODIUM CHLORIDE 0.9 % IV SOLN: 8.0000 mg | Freq: Once | INTRAVENOUS | Status: DC

## 2016-08-24 NOTE — MAU Note (Signed)
Patient presents with left lower abdominal pain for the past 3 days. Patient is also having N/V. Patient was on diclegis but has run out.  Patient states that her family also witnessed.

## 2016-08-25 DIAGNOSIS — O210 Mild hyperemesis gravidarum: Secondary | ICD-10-CM | POA: Diagnosis not present

## 2016-08-25 MED ORDER — PROMETHAZINE HCL 25 MG RE SUPP: 25.0000 mg | Freq: Four times a day (QID) | RECTAL | 1 refills | Status: DC | PRN

## 2016-08-25 NOTE — Discharge Instructions (Signed)
Eating Plan for Hyperemesis Gravidarum °Hyperemesis gravidarum is a severe form of morning sickness. Because this condition causes severe nausea and vomiting, it can lead to dehydration, malnutrition, and weight loss. One way to lessen the symptoms of nausea and vomiting is to follow the eating plan for hyperemesis gravidarum. It is often used along with prescribed medicines to control your symptoms. °What can I do to relieve my symptoms? °Listen to your body. Everyone is different and has different preferences. Find what works best for you. Take any of the following actions that are helpful to you: °· Eat and drink slowly. °· Eat 5-6 small meals daily instead of 3 large meals. °· Eat crackers before you get out of bed in the morning. °· Try having a snack in the middle of the night. °· Starchy foods are usually tolerated well. Examples include cereal, toast, bread, potatoes, pasta, rice, and pretzels. °· Ginger may help with nausea. Add ¼ tsp ground ginger to hot tea or choose ginger tea. °· Try drinking 100% fruit juice or an electrolyte drink. An electrolyte drink contains sodium, potassium, and chloride. °· Continue to take your prenatal vitamins as told by your health care provider. If you are having trouble taking your prenatal vitamins, talk with your health care provider about different options. °· Include at least 1 serving of protein with your meals and snacks. Protein options include meats or poultry, beans, nuts, eggs, and yogurt. Try eating a protein-rich snack before bed. Examples of these snacks include cheese and crackers or half of a peanut butter or turkey sandwich. °· Consider eliminating foods that trigger your symptoms. These may include spicy foods, coffee, high-fat foods, very sweet foods, and acidic foods. °· Try meals that have more protein combined with bland, salty, lower-fat, and dry foods, such as nuts, seeds, pretzels, crackers, and cereal. °· Talk with your healthcare provider about  starting a supplement of vitamin B6. °· Have fluids that are cold, clear, and carbonated or sour. Examples include lemonade, ginger ale, lemon-lime soda, ice water, and sparkling water. °· Try lemon or mint tea. °· Try brushing your teeth or using a mouth rinse after meals. °What should I avoid to reduce my symptoms? °Avoiding some of the following things may help reduce your symptoms. °· Foods with strong smells. Try eating meals in well-ventilated areas that are free of odors. °· Drinking water or other beverages with meals. Try not to drink anything during the 30 minutes before and after your meals. °· Drinking more than 1 cup of fluid at a time. Sometimes using a straw helps. °· Fried or high-fat foods, such as butter and cream sauces. °· Spicy foods. °· Skipping meals as best as you can. Nausea can be more intense on an empty stomach. If you cannot tolerate food at that time, do not force it. Try sucking on ice chips or other frozen items, and make up for missed calories later. °· Lying down within 2 hours after eating. °· Environmental triggers. These may include smoky rooms, closed spaces, rooms with strong smells, warm or humid places, overly loud and noisy rooms, and rooms with motion or flickering lights. °· Quick and sudden changes in your movement. °This information is not intended to replace advice given to you by your health care provider. Make sure you discuss any questions you have with your health care provider. °Document Released: 03/28/2007 Document Revised: 01/28/2016 Document Reviewed: 12/30/2015 °Elsevier Interactive Patient Education © 2017 [employer] ° °

## 2016-08-25 NOTE — MAU Provider Note (Signed)
Patient Kathleen Rice is a 26 year old G1P0 at 10 weeks and 2 days by early US here with complaints of nausea and vomiting for three days. She has been taking reglan for morning sickness but it has stopped helping. Her insurance does not cover diclegis so she hasn't filled her RX.  Patient arrived via EMS after she felt light-headed in the shower.  History     CSN: [id number]  Arrival date and time: 08/24/16 2157   First Provider Initiated Contact with Patient 08/24/16 2243      Chief Complaint  Patient presents with  . Abdominal Pain   Emesis   This is a new problem. The current episode started in the past 7 days. The problem occurs more than 10 times per day. The problem has been unchanged. The emesis has an appearance of stomach contents. There has been no fever. Associated symptoms include abdominal pain. Pertinent negatives include no arthralgias, chest pain, chills, coughing, diarrhea, dizziness, fever or headaches. Treatments tried: reglan.    OB History    Gravida Para Term Preterm AB Living   1             SAB TAB Ectopic Multiple Live Births                  Past Medical History:  Diagnosis Date  . Peripheral neuropathy [facility](HCC)     Past Surgical History:  Procedure Laterality Date  . CHOLECYSTECTOMY      History reviewed. No pertinent family history.  Social History  Substance Use Topics  . Smoking status: Never Smoker  . Smokeless tobacco: Never Used  . Alcohol use No    Allergies: No Known Allergies  Prescriptions Prior to Admission  Medication Sig Dispense Refill Last Dose  . acetaminophen (TYLENOL) 500 MG tablet Take 500 mg by mouth every 6 (six) hours as needed for mild pain or moderate pain.   08/10/2016 at Unknown time  . Doxylamine-Pyridoxine (DICLEGIS) 10-10 MG TBEC Take one tablet by mouth at bedtime. You may redose 1-2 tablets in the morning if her nausea persists. 60 tablet 0 08/10/2016 at Unknown time  . Doxylamine-Pyridoxine 10-10 MG TBEC 2  tablets on an empty stomach at bedtime on day 1 and 2. If symptoms persist, take 1 tablet in the morning and 2 tablets at bedtime on day 3. If symptoms persist, increase to 1 tablet in the morning, 1 tablet in the afternoon, and 2 tablets at bedtime daily. 60 tablet 0   . guaiFENesin-codeine 100-10 MG/5ML syrup Take 10 mLs by mouth 3 (three) times daily as needed. (Patient not taking: Reported on 11/25/2015) 120 mL 0 Completed Course at Unknown time  . HYDROcodone-acetaminophen (NORCO/VICODIN) 5-325 MG tablet Take 1-2 tablets by mouth every 6 (six) hours as needed. (Patient not taking: Reported on 07/21/2016) 15 tablet 0 Completed Course at Unknown time  . ondansetron (ZOFRAN ODT) 4 MG disintegrating tablet Take 1 tablet (4 mg total) by mouth every 8 (eight) hours as needed for nausea or vomiting. 15 tablet 0   . ondansetron (ZOFRAN) 4 MG tablet Take 1 tablet (4 mg total) by mouth every 6 (six) hours. (Patient not taking: Reported on 07/21/2016) 12 tablet 0 Completed Course at Unknown time  . predniSONE (STERAPRED UNI-PAK 21 TAB) 10 MG (21) TBPK tablet Take 6 tablets on day 1 Take 5 tablets on day 2 Take 4 tablets on day 3 Take 3 tablets on day 4 Take 2 tablets on day 5 Take  1 tablet on day 6 (Patient not taking: Reported on 11/25/2015) 21 tablet 0 Completed Course at Unknown time  . Prenatal Vit-Fe Fumarate-FA (MULTIVITAMIN-PRENATAL) 27-0.8 MG TABS tablet Take 1 tablet by mouth daily at 12 noon.   08/10/2016 at Unknown time    Review of Systems  Constitutional: Negative for chills and fever.  HENT: Negative.   Respiratory: Negative for cough.   Cardiovascular: Negative for chest pain.  Gastrointestinal: Positive for abdominal pain and vomiting. Negative for diarrhea.  Genitourinary: Negative.   Musculoskeletal: Negative for arthralgias.  Neurological: Negative for dizziness and headaches.  Psychiatric/Behavioral: Negative.    Physical Exam   Blood pressure 105/86, pulse 70, temperature 98.5 F  (36.9 C), temperature source Oral, resp. rate 18, last menstrual period 06/14/2016, SpO2 100 %.  Physical Exam  Constitutional: She is oriented to person, place, and time. She appears well-developed and well-nourished.  HENT:  Head: Normocephalic.  Neck: Normal range of motion.  Respiratory: Effort normal.  GI: Soft. She exhibits no distension and no mass. There is no tenderness. There is no rebound and no guarding.  Musculoskeletal: Normal range of motion.  Neurological: She is alert and oriented to person, place, and time.  Skin: Skin is warm and dry.  Psychiatric: She has a normal mood and affect.    MAU Course  Procedures  MDM -UA shows 80 of ketones -IV fluids plus IV phenergan; patient now feeling much better although still has some abdominal pain which she says is due to throwing up -FHR is 160 by doppler Assessment and Plan   1. Morning sickness    2. Patient stable for discharge. Reviewed with patient importance of high protein diet and not letting herself get hungry. Prescriped phenergan suppositories for nighttime use to help with sleep and nausea. Encouraged her to keep using the reglan as maybe the phenergan plus reglan will help; I explained that it might take a few different tries to get the right anti-emetic combination for her.  3. Patient verbalized understanding and when to return to the MAU (bleeding, worsening symptoms, unable to keep food down).     Charlesetta Garibaldi Adisyn Ruscitti CNM 08/25/2016, 1:12 AM

## 2016-08-30 ENCOUNTER — Inpatient Hospital Stay (HOSPITAL_COMMUNITY)
Admission: AD | Admit: 2016-08-30 | Discharge: 2016-08-31 | Disposition: A | Discharge status: Home / Self Care | Source: Ambulatory Visit | Attending: Obstetrics and Gynecology | Admitting: Obstetrics and Gynecology

## 2016-08-30 ENCOUNTER — Encounter (HOSPITAL_COMMUNITY): Payer: Self-pay

## 2016-08-30 DIAGNOSIS — O219 Vomiting of pregnancy, unspecified: Secondary | ICD-10-CM | POA: Diagnosis not present

## 2016-08-30 DIAGNOSIS — K219 Gastro-esophageal reflux disease without esophagitis: Secondary | ICD-10-CM | POA: Insufficient documentation

## 2016-08-30 DIAGNOSIS — R109 Unspecified abdominal pain: Secondary | ICD-10-CM | POA: Diagnosis present

## 2016-08-30 DIAGNOSIS — Z3A11 11 weeks gestation of pregnancy: Secondary | ICD-10-CM | POA: Diagnosis not present

## 2016-08-30 DIAGNOSIS — O99611 Diseases of the digestive system complicating pregnancy, first trimester: Secondary | ICD-10-CM | POA: Insufficient documentation

## 2016-08-30 DIAGNOSIS — Z79899 Other long term (current) drug therapy: Secondary | ICD-10-CM | POA: Insufficient documentation

## 2016-08-30 LAB — URINALYSIS, ROUTINE W REFLEX MICROSCOPIC
Bacteria, UA: NONE SEEN
Bilirubin Urine: NEGATIVE
GLUCOSE, UA: NEGATIVE mg/dL
Hgb urine dipstick: NEGATIVE
KETONES UR: NEGATIVE mg/dL
Nitrite: NEGATIVE
PH: 5 (ref 5.0–8.0)
PROTEIN: NEGATIVE mg/dL
Specific Gravity, Urine: 1.029 (ref 1.005–1.030)

## 2016-08-30 MED ORDER — GI COCKTAIL ~~LOC~~
30.0000 mL | Freq: Once | ORAL | Status: AC
  Administered 2016-08-31: 30 mL via ORAL
  Filled 2016-08-30: qty 30

## 2016-08-30 MED ORDER — PROMETHAZINE HCL 25 MG/ML IJ SOLN
25.0000 mg | Freq: Once | INTRAMUSCULAR | Status: AC
  Administered 2016-08-31: 25 mg via INTRAMUSCULAR
  Filled 2016-08-30: qty 1

## 2016-08-30 NOTE — MAU Provider Note (Signed)
History     CSN: [id number]  Arrival date and time: 08/30/16 2259   First Provider Initiated Contact with Patient 08/30/16 2333      Chief Complaint  Patient presents with  . Abdominal Pain  . Emesis   Abdominal Pain  This is a new problem. The current episode started in the past 7 days. The onset quality is gradual. The problem occurs constantly. The problem has been unchanged. The pain is located in the suprapubic region and epigastric region. The pain is at a severity of 10/10. The quality of the pain is sharp. The abdominal pain radiates to the chest and back. Associated symptoms include vomiting. Pertinent negatives include no dysuria or fever. The pain is aggravated by movement. The pain is relieved by nothing. She has tried acetaminophen for the symptoms. The treatment provided no relief (threw up the tylenol ).  Emesis   This is a new problem. The current episode started 1 to 4 weeks ago. The problem occurs 2 to 4 times per day. The problem has been unchanged. The emesis has an appearance of stomach contents. There has been no fever. Associated symptoms include abdominal pain and chest pain. Pertinent negatives include no chills or fever. Risk factors: pregnancy  Treatments tried: diclegis, zofran and phenergan suppositories  Improvement on treatment: phenergn is helping.     Past Medical History:  Diagnosis Date  . Peripheral neuropathy [facility](HCC)     Past Surgical History:  Procedure Laterality Date  . CHOLECYSTECTOMY      No family history on file.  Social History  Substance Use Topics  . Smoking status: Never Smoker  . Smokeless tobacco: Never Used  . Alcohol use No    Allergies: No Known Allergies  Prescriptions Prior to Admission  Medication Sig Dispense Refill Last Dose  . acetaminophen (TYLENOL) 500 MG tablet Take 500 mg by mouth every 6 (six) hours as needed for mild pain or moderate pain.   08/10/2016 at Unknown time  . guaiFENesin-codeine 100-10 MG/5ML syrup  Take 10 mLs by mouth 3 (three) times daily as needed. (Patient not taking: Reported on 11/25/2015) 120 mL 0 Completed Course at Unknown time  . ondansetron (ZOFRAN ODT) 4 MG disintegrating tablet Take 1 tablet (4 mg total) by mouth every 8 (eight) hours as needed for nausea or vomiting. 15 tablet 0   . ondansetron (ZOFRAN) 4 MG tablet Take 1 tablet (4 mg total) by mouth every 6 (six) hours. (Patient not taking: Reported on 07/21/2016) 12 tablet 0 Completed Course at Unknown time  . predniSONE (STERAPRED UNI-PAK 21 TAB) 10 MG (21) TBPK tablet Take 6 tablets on day 1 Take 5 tablets on day 2 Take 4 tablets on day 3 Take 3 tablets on day 4 Take 2 tablets on day 5 Take 1 tablet on day 6 (Patient not taking: Reported on 11/25/2015) 21 tablet 0 Completed Course at Unknown time  . Prenatal Vit-Fe Fumarate-FA (MULTIVITAMIN-PRENATAL) 27-0.8 MG TABS tablet Take 1 tablet by mouth daily at 12 noon.   08/10/2016 at Unknown time  . promethazine (PHENERGAN) 25 MG suppository Place 1 suppository (25 mg total) rectally every 6 (six) hours as needed for nausea. 12 suppository 1     Review of Systems  Constitutional: Negative for chills and fever.  Cardiovascular: Positive for chest pain.  Gastrointestinal: Positive for abdominal pain and vomiting.  Genitourinary: Negative for dysuria, pelvic pain, vaginal bleeding and vaginal discharge.   Physical Exam   Blood pressure 106/62, pulse 80, temperature  97.9 F (36.6 C), temperature source Oral, resp. rate 16, height 5\' 3"  (1.6 m), weight 145 lb (65.8 kg), last menstrual period 06/14/2016.  Physical Exam  Nursing note and vitals reviewed. Constitutional: She is oriented to person, place, and time. She appears well-developed and well-nourished. No distress.  HENT:  Head: Normocephalic.  Cardiovascular: Normal rate.   Respiratory: Effort normal. She exhibits no tenderness.  GI: Soft. There is no tenderness. There is no rebound.  Genitourinary:  Genitourinary  Comments: FHT: 167 with doppler   Neurological: She is alert and oriented to person, place, and time.  Skin: Skin is warm and dry.   Results for orders placed or performed during the hospital encounter of 08/30/16 (from the past 24 hour(s))  Urinalysis, Routine w reflex microscopic     Status: Abnormal   Collection Time: 08/30/16 11:14 PM  Result Value Ref Range   Color, Urine YELLOW YELLOW   APPearance HAZY (A) CLEAR   Specific Gravity, Urine 1.029 1.005 - 1.030   pH 5.0 5.0 - 8.0   Glucose, UA NEGATIVE NEGATIVE mg/dL   Hgb urine dipstick NEGATIVE NEGATIVE   Bilirubin Urine NEGATIVE NEGATIVE   Ketones, ur NEGATIVE NEGATIVE mg/dL   Protein, ur NEGATIVE NEGATIVE mg/dL   Nitrite NEGATIVE NEGATIVE   Leukocytes, UA TRACE (A) NEGATIVE   RBC / HPF 0-5 0 - 5 RBC/hpf   WBC, UA 0-5 0 - 5 WBC/hpf   Bacteria, UA NONE SEEN NONE SEEN   Squamous Epithelial / LPF 0-5 (A) NONE SEEN   Mucous PRESENT      MAU Course  Procedures  MDM Patient has had phenergan and GI cocktail. She reports that her pain is better.  EKG: NSR  Assessment and Plan   1. Gastroesophageal reflux disease, esophagitis presence not specified   2. Nausea/vomiting in pregnancy   3. [redacted] weeks gestation of pregnancy    DC home Comfort measures reviewed  1st/2nd Trimester precautions  RX: pepcid 40mg  QD #30 with 3RF, Phenergan 25mg  PO #30 with 3RF  Return to MAU as needed FU with OB as planned  Follow-up Information    [facility] Follow up.   Contact information: [address] [place] [address] [phone]            Tawnya Crook 08/30/2016, 11:34 PM

## 2016-08-30 NOTE — MAU Note (Signed)
Pt presents complaining of shooting pains in her chest and back. States it shoots up and she feels like she can't move. States she is throwing up 4 times every 24 hours. Denies vaginal bleeding or discharge.

## 2016-08-31 DIAGNOSIS — K219 Gastro-esophageal reflux disease without esophagitis: Secondary | ICD-10-CM | POA: Diagnosis not present

## 2016-08-31 MED ORDER — FAMOTIDINE 40 MG PO TABS: 40.0000 mg | ORAL_TABLET | Freq: Every day | ORAL | 3 refills | Status: AC

## 2016-08-31 MED ORDER — PROMETHAZINE HCL 25 MG PO TABS: 12.5000 mg | ORAL_TABLET | Freq: Four times a day (QID) | ORAL | 3 refills | Status: DC | PRN

## 2016-08-31 NOTE — Discharge Instructions (Signed)
Food Choices for Gastroesophageal Reflux Disease, Adult When you have gastroesophageal reflux disease (GERD), the foods you eat and your eating habits are very important. Choosing the right foods can help ease your discomfort. What guidelines do I need to follow?  Choose fruits, vegetables, whole grains, and low-fat dairy products.  Choose low-fat meat, fish, and poultry.  Limit fats such as oils, salad dressings, butter, nuts, and avocado.  Keep a food diary. This helps you identify foods that cause symptoms.  Avoid foods that cause symptoms. These may be different for everyone.  Eat small meals often instead of 3 large meals a day.  Eat your meals slowly, in a place where you are relaxed.  Limit fried foods.  Cook foods using methods other than frying.  Avoid drinking alcohol.  Avoid drinking large amounts of liquids with your meals.  Avoid bending over or lying down until 2-3 hours after eating. What foods are not recommended? These are some foods and drinks that may make your symptoms worse: Vegetables  Tomatoes. Tomato juice. Tomato and spaghetti sauce. Chili peppers. Onion and garlic. Horseradish. Fruits  Oranges, grapefruit, and lemon (fruit and juice). Meats  High-fat meats, fish, and poultry. This includes hot dogs, ribs, ham, sausage, salami, and bacon. Dairy  Whole milk and chocolate milk. Sour cream. Cream. Butter. Ice cream. Cream cheese. Drinks  Coffee and tea. Bubbly (carbonated) drinks or energy drinks. Condiments  Hot sauce. Barbecue sauce. Sweets/Desserts  Chocolate and cocoa. Donuts. Peppermint and spearmint. Fats and Oils  High-fat foods. This includes French fries and potato chips. Other  Vinegar. Strong spices. This includes black pepper, white pepper, red pepper, cayenne, curry powder, cloves, ginger, and chili powder. The items listed above may not be a complete list of foods and drinks to avoid. Contact your dietitian for more information.    This information is not intended to replace advice given to you by your health care provider. Make sure you discuss any questions you have with your health care provider. Document Released: 11/30/2011 Document Revised: 11/06/2015 Document Reviewed: 04/04/2013 Elsevier Interactive Patient Education  2017 [employer]  

## 2016-09-28 ENCOUNTER — Emergency Department (HOSPITAL_COMMUNITY)
Admission: EM | Admit: 2016-09-28 | Discharge: 2016-09-28 | Disposition: A | Discharge status: Home / Self Care | Attending: Emergency Medicine | Admitting: Emergency Medicine

## 2016-09-28 DIAGNOSIS — Z3A15 15 weeks gestation of pregnancy: Secondary | ICD-10-CM | POA: Insufficient documentation

## 2016-09-28 DIAGNOSIS — Z79899 Other long term (current) drug therapy: Secondary | ICD-10-CM | POA: Diagnosis not present

## 2016-09-28 DIAGNOSIS — O211 Hyperemesis gravidarum with metabolic disturbance: Secondary | ICD-10-CM | POA: Insufficient documentation

## 2016-09-28 DIAGNOSIS — O210 Mild hyperemesis gravidarum: Secondary | ICD-10-CM

## 2016-09-28 LAB — URINALYSIS, ROUTINE W REFLEX MICROSCOPIC
BILIRUBIN URINE: NEGATIVE
Glucose, UA: NEGATIVE mg/dL
Hgb urine dipstick: NEGATIVE
KETONES UR: 20 mg/dL — AB
Nitrite: NEGATIVE
PH: 5 (ref 5.0–8.0)
PROTEIN: NEGATIVE mg/dL
Specific Gravity, Urine: 1.026 (ref 1.005–1.030)

## 2016-09-28 LAB — BASIC METABOLIC PANEL
Anion gap: 6 (ref 5–15)
BUN: 12 mg/dL (ref 6–20)
CO2: 25 mmol/L (ref 22–32)
Calcium: 9 mg/dL (ref 8.9–10.3)
Chloride: 104 mmol/L (ref 101–111)
Creatinine, Ser: 0.6 mg/dL (ref 0.44–1.00)
GFR calc Af Amer: >60 mL/min (ref >60)
GLUCOSE: 93 mg/dL (ref 65–99)
POTASSIUM: 3.4 mmol/L — AB (ref 3.5–5.1)
Sodium: 135 mmol/L (ref 135–145)

## 2016-09-28 LAB — CBC WITH DIFFERENTIAL/PLATELET
Basophils Absolute: 0 10*3/uL (ref 0.0–0.1)
Basophils Relative: 0 %
EOS PCT: 1 %
Eosinophils Absolute: 0.1 10*3/uL (ref 0.0–0.7)
HEMATOCRIT: 33.1 % — AB (ref 36.0–46.0)
Hemoglobin: 11.4 g/dL — ABNORMAL LOW (ref 12.0–15.0)
LYMPHS ABS: 1.7 10*3/uL (ref 0.7–4.0)
LYMPHS PCT: 20 %
MCH: 27.9 pg (ref 26.0–34.0)
MCHC: 34.4 g/dL (ref 30.0–36.0)
MCV: 80.9 fL (ref 78.0–100.0)
MONO ABS: 0.9 10*3/uL (ref 0.1–1.0)
Monocytes Relative: 11 %
Neutro Abs: 5.8 10*3/uL (ref 1.7–7.7)
Neutrophils Relative %: 68 %
PLATELETS: 219 10*3/uL (ref 150–400)
RBC: 4.09 MIL/uL (ref 3.87–5.11)
RDW: 13.7 % (ref 11.5–15.5)
WBC: 8.5 10*3/uL (ref 4.0–10.5)

## 2016-09-28 MED ORDER — PROMETHAZINE HCL 25 MG RE SUPP: 25.0000 mg | Freq: Four times a day (QID) | RECTAL | 1 refills | Status: DC | PRN

## 2016-09-28 MED ORDER — SODIUM CHLORIDE 0.9 % IV BOLUS (SEPSIS)
1000.0000 mL | Freq: Once | INTRAVENOUS | Status: AC
  Administered 2016-09-28: 1000 mL via INTRAVENOUS

## 2016-09-28 MED ORDER — ONDANSETRON HCL 4 MG/2ML IJ SOLN
4.0000 mg | Freq: Once | INTRAMUSCULAR | Status: AC
  Administered 2016-09-28: 4 mg via INTRAVENOUS
  Filled 2016-09-28: qty 2

## 2016-09-28 NOTE — ED Provider Notes (Signed)
[facility]-EMERGENCY DEPT Provider Note   CSN: [id number] Arrival date & time: 09/28/16  1201     History   Chief Complaint Chief Complaint  Patient presents with  . Nausea  . Emesis    HPI Kathleen Rice is a 26 y.o. female.  Pt presents to the ED with n/v.  The pt is [redacted] weeks pregnant and has been taking phenergan suppositories, but she is out of them.  The pt does have some right lower back pian.  The pt denies any abdominal pain.  Pt has had an [place] here in Feb which did show an IUP.  She is followed by an Ob in [place].      Past Medical History:  Diagnosis Date  . Peripheral neuropathy (HCC)     There are no active problems to display for this patient.   Past Surgical History:  Procedure Laterality Date  . CHOLECYSTECTOMY      OB History    Gravida Para Term Preterm AB Living   1             SAB TAB Ectopic Multiple Live Births                   Home Medications    Prior to Admission medications   Medication Sig Start Date End Date Taking? Authorizing Provider  famotidine (PEPCID) 40 MG tablet Take 1 tablet (40 mg total) by mouth daily. 08/31/16  Yes Armando Reichert, CNM  Prenatal Vit-Fe Fumarate-FA (MULTIVITAMIN-PRENATAL) 27-0.8 MG TABS tablet Take 1 tablet by mouth daily at 12 noon.   Yes Historical Provider, MD  acetaminophen (TYLENOL) 500 MG tablet Take 500 mg by mouth every 6 (six) hours as needed for mild pain or moderate pain.    Historical Provider, MD  guaiFENesin-codeine 100-10 MG/5ML syrup Take 10 mLs by mouth 3 (three) times daily as needed. Patient not taking: Reported on 11/25/2015 07/11/15   Chinita Pester, FNP  ondansetron (ZOFRAN ODT) 4 MG disintegrating tablet Take 1 tablet (4 mg total) by mouth every 8 (eight) hours as needed for nausea or vomiting. 08/11/16   Mercedes Street, PA-C  ondansetron (ZOFRAN) 4 MG tablet Take 1 tablet (4 mg total) by mouth every 6 (six) hours. Patient not taking: Reported on 07/21/2016 11/25/15   Santiago Glad,  PA-C  predniSONE (STERAPRED UNI-PAK 21 TAB) 10 MG (21) TBPK tablet Take 6 tablets on day 1 Take 5 tablets on day 2 Take 4 tablets on day 3 Take 3 tablets on day 4 Take 2 tablets on day 5 Take 1 tablet on day 6 Patient not taking: Reported on 11/25/2015 07/11/15   Chinita Pester, FNP  promethazine (PHENERGAN) 25 MG suppository Place 1 suppository (25 mg total) rectally every 6 (six) hours as needed for nausea. 09/28/16 09/28/17  Jacalyn Lefevre, MD    Family History No family history on file.  Social History Social History  Substance Use Topics  . Smoking status: Never Smoker  . Smokeless tobacco: Never Used  . Alcohol use No     Allergies   Patient has no known allergies.   Review of Systems Review of Systems  Gastrointestinal: Positive for nausea and vomiting.  All other systems reviewed and are negative.    Physical Exam Updated Vital Signs BP 100/68 (BP Location: Right Arm)   Pulse 65   Temp 98.7 F (37.1 C) (Oral)   Resp 18   LMP 06/14/2016   SpO2 100%   Physical Exam  Constitutional: She is oriented to person, place, and time. She appears well-developed and well-nourished.  HENT:  Head: Normocephalic and atraumatic.  Right Ear: External ear normal.  Left Ear: External ear normal.  Nose: Nose normal.  Mouth/Throat: Oropharynx is clear and moist.  Eyes: Conjunctivae and EOM are normal. Pupils are equal, round, and reactive to light.  Neck: Normal range of motion. Neck supple.  Cardiovascular: Normal rate, regular rhythm, normal heart sounds and intact distal pulses.   Pulmonary/Chest: Effort normal and breath sounds normal.  Abdominal: Soft. Bowel sounds are normal.  Musculoskeletal: Normal range of motion.  Neurological: She is alert and oriented to person, place, and time.  Skin: Skin is warm.  Psychiatric: She has a normal mood and affect. Her behavior is normal. Judgment and thought content normal.  Nursing note and vitals reviewed.    ED Treatments  / Results  Labs (all labs ordered are listed, but only abnormal results are displayed) Labs Reviewed  CBC WITH DIFFERENTIAL/PLATELET - Abnormal; Notable for the following:       Result Value   Hemoglobin 11.4 (*)    HCT 33.1 (*)    All other components within normal limits  BASIC METABOLIC PANEL - Abnormal; Notable for the following:    Potassium 3.4 (*)    All other components within normal limits  URINALYSIS, ROUTINE W REFLEX MICROSCOPIC - Abnormal; Notable for the following:    APPearance HAZY (*)    Ketones, ur 20 (*)    Leukocytes, UA TRACE (*)    Bacteria, UA RARE (*)    Squamous Epithelial / LPF 0-5 (*)    All other components within normal limits    EKG  EKG Interpretation None       Radiology No results found.  Procedures [place] bedside Date/Time: 09/28/2016 5:26 PM Performed by: Jacalyn Lefevre Authorized by: Jacalyn Lefevre  Consent: Verbal consent obtained. Risks and benefits: risks, benefits and alternatives were discussed Consent given by: patient Patient understanding: patient states understanding of the procedure being performed Patient consent: the patient's understanding of the procedure matches consent given Procedure consent: procedure consent matches procedure scheduled Relevant documents: relevant documents present and verified Patient identity confirmed: verbally with patient Patient tolerance: Patient tolerated the procedure well with no immediate complications Comments: Bedside US done while pt supine.  She does have an IUP with good fetal heart tones.    (including critical care time)  Medications Ordered in ED Medications  sodium chloride 0.9 % bolus 1,000 mL (1,000 mLs Intravenous New Bag/Given 09/28/16 1616)  ondansetron (ZOFRAN) injection 4 mg (4 mg Intravenous Given 09/28/16 1617)     Initial Impression / Assessment and Plan / ED Course  I have reviewed the triage vital signs and the nursing notes.  Pertinent labs & imaging results  that were available during my care of the patient were reviewed by me and considered in my medical decision making (see chart for details).      Pt is feeling much better.  She is given a rx for phenergan suppositories.  She knows to f/u with her obgyn.  Return if worse.  Final Clinical Impressions(s) / ED Diagnoses   Final diagnoses:  Hyperemesis gravidarum    New Prescriptions Current Discharge Medication List       Jacalyn Lefevre, MD 09/28/16 1727

## 2016-09-28 NOTE — ED Triage Notes (Signed)
Pt [redacted] weeks pregnant, patient c/o nausea and emesis x 3 days and lower back pain that radiates to R buttock. Pt states she has suppositories for nausea but ran out. Pt reports she is unable to take po fluids and feels dizzy / light headed with movement.

## 2016-10-11 ENCOUNTER — Encounter (HOSPITAL_COMMUNITY): Payer: Self-pay | Admitting: *Deleted

## 2016-10-11 ENCOUNTER — Inpatient Hospital Stay (HOSPITAL_COMMUNITY)

## 2016-10-11 ENCOUNTER — Inpatient Hospital Stay (HOSPITAL_COMMUNITY)
Admission: AD | Admit: 2016-10-11 | Discharge: 2016-10-11 | Disposition: A | Discharge status: Home / Self Care | Source: Ambulatory Visit | Attending: Family Medicine | Admitting: Family Medicine

## 2016-10-11 DIAGNOSIS — O021 Missed abortion: Secondary | ICD-10-CM | POA: Diagnosis present

## 2016-10-11 DIAGNOSIS — O039 Complete or unspecified spontaneous abortion without complication: Secondary | ICD-10-CM

## 2016-10-11 LAB — TYPE AND SCREEN
ABO/RH(D): A POS
ANTIBODY SCREEN: NEGATIVE

## 2016-10-11 LAB — CBC
HEMATOCRIT: 35.7 % — AB (ref 36.0–46.0)
Hemoglobin: 11.7 g/dL — ABNORMAL LOW (ref 12.0–15.0)
MCH: 27.1 pg (ref 26.0–34.0)
MCHC: 32.8 g/dL (ref 30.0–36.0)
MCV: 82.8 fL (ref 78.0–100.0)
PLATELETS: 292 10*3/uL (ref 150–400)
RBC: 4.31 MIL/uL (ref 3.87–5.11)
RDW: 14 % (ref 11.5–15.5)
WBC: 9.3 10*3/uL (ref 4.0–10.5)

## 2016-10-11 NOTE — Discharge Instructions (Signed)
Return tomorrow at 1 pm for surgery (scheduled at 3) do not eat or drink anything after 7 am please  Dilation and Curettage or Vacuum Curettage Dilation and curettage (D&C) and vacuum curettage are minor procedures. A D&C involves stretching (dilation) the cervix and scraping (curettage) the inside lining of the uterus (endometrium). During a D&C, tissue is gently scraped from the endometrium, starting from the top portion of the uterus down to the lowest part of the uterus (cervix). During a vacuum curettage, the lining and tissue in the uterus are removed with the use of gentle suction. Curettage may be performed to either diagnose or treat a problem. As a diagnostic procedure, curettage is performed to examine tissues from the uterus. A diagnostic curettage may be done if you have:  Irregular bleeding in the uterus.  Bleeding with the development of clots.  Spotting between menstrual periods.  Prolonged menstrual periods or other abnormal bleeding.  Bleeding after menopause.  No menstrual period (amenorrhea).  A change in size and shape of the uterus.  Abnormal endometrial cells discovered during a Pap test. As a treatment procedure, curettage may be performed for the following reasons:  Removal of an IUD (intrauterine device).  Removal of retained placenta after giving birth.  Abortion.  Miscarriage.  Removal of endometrial polyps.  Removal of uncommon types of noncancerous lumps (fibroids). Tell a health care provider about:  Any allergies you have, including allergies to prescribed medicine or latex.  All medicines you are taking, including vitamins, herbs, eye drops, creams, and over-the-counter medicines. This is especially important if you take any blood-thinning medicine. Bring a list of all of your medicines to your appointment.  Any problems you or family members have had with anesthetic medicines.  Any blood disorders you have.  Any surgeries you have  had.  Your medical history and any medical conditions you have.  Whether you are pregnant or may be pregnant.  Recent vaginal infections you have had.  Recent menstrual periods, bleeding problems you have had, and what form of birth control (contraception) you use. What are the risks? Generally, this is a safe procedure. However, problems may occur, including:  Infection.  Heavy vaginal bleeding.  Allergic reactions to medicines.  Damage to the cervix or other structures or organs.  Development of scar tissue (adhesions) inside the uterus, which can cause abnormal amounts of menstrual bleeding. This may make it harder to get pregnant in the future.  A hole (perforation) or puncture in the uterine wall. This is rare. What happens before the procedure? Staying hydrated  Follow instructions from your health care provider about hydration, which may include:  Up to 2 hours before the procedure - you may continue to drink clear liquids, such as water, clear fruit juice, black coffee, and plain tea. Eating and drinking restrictions  Follow instructions from your health care provider about eating and drinking, which may include:  8 hours before the procedure - stop eating heavy meals or foods such as meat, fried foods, or fatty foods.  6 hours before the procedure - stop eating light meals or foods, such as toast or cereal.  6 hours before the procedure - stop drinking milk or drinks that contain milk.  2 hours before the procedure - stop drinking clear liquids. If your health care provider told you to take your medicine(s) on the day of your procedure, take them with only a sip of water. Medicines   Ask your health care provider about:  Changing or  stopping your regular medicines. This is especially important if you are taking diabetes medicines or blood thinners.  Taking medicines such as aspirin and ibuprofen. These medicines can thin your blood. Do not take these medicines  before your procedure if your health care provider instructs you not to.  You may be given antibiotic medicine to help prevent infection. General instructions   For 24 hours before your procedure, do not:  Douche.  Use tampons.  Use medicines, creams, or suppositories in the vagina.  Have sexual intercourse.  You may be given a pregnancy test on the day of the procedure.  Plan to have someone take you home from the hospital or clinic.  You may have a blood or urine sample taken.  If you will be going home right after the procedure, plan to have someone with you for 24 hours. What happens during the procedure?  To reduce your risk of infection:  Your health care team will wash or sanitize their hands.  Your skin will be washed with soap.  An IV tube will be inserted into one of your veins.  You will be given one of the following:  A medicine that numbs the area in and around the cervix (local anesthetic).  A medicine to make you fall asleep (general anesthetic).  You will lie down on your back, with your feet in foot rests (stirrups).  The size and position of your uterus will be checked.  A lubricated instrument (speculum or Sims retractor) will be inserted into the back side of your vagina. The speculum will be used to hold apart the walls of your vagina so your health care provider can see your cervix.  A tool (tenaculum) will be attached to the lip of the cervix to stabilize it.  Your cervix will be softened and dilated. This may be done by:  Taking a medicine.  Having tapered dilators or thin rods (laminaria) or gradual widening instruments (tapered dilators) inserted into your cervix.  A small, sharp, curved instrument (curette) will be used to scrape a small amount of tissue or cells from the endometrium or cervical canal. In some cases, gentle suction is applied with the curette. The curette will then be removed. The cells will be taken to a lab for  testing. The procedure may vary among health care providers and hospitals. What happens after the procedure?  You may have mild cramping, backache, pain, and light bleeding or spotting. You may pass small blood clots from your vagina.  You may have to wear compression stockings. These stockings help to prevent blood clots and reduce swelling in your legs.  Your blood pressure, heart rate, breathing rate, and blood oxygen level will be monitored until the medicines you were given have worn off. Summary  Dilation and curettage (D&C) involves stretching (dilation) the cervix and scraping (curettage) the inside lining of the uterus (endometrium).  After the procedure, you may have mild cramping, backache, pain, and light bleeding or spotting. You may pass small blood clots from your vagina.  Plan to have someone take you home from the hospital or clinic. This information is not intended to replace advice given to you by your health care provider. Make sure you discuss any questions you have with your health care provider. Document Released: 05/31/2005 Document Revised: 02/15/2016 Document Reviewed: 02/15/2016 Elsevier Interactive Patient Education  2017 [employer]. Miscarriage A miscarriage is the sudden loss of an unborn baby (fetus) before the 20th week of pregnancy. Most miscarriages happen  in the first 3 months of pregnancy. Sometimes, it happens before a woman even knows she is pregnant. A miscarriage is also called a "spontaneous miscarriage" or "early pregnancy loss." Having a miscarriage can be an emotional experience. Talk with your caregiver about any questions you may have about miscarrying, the grieving process, and your future pregnancy plans. What are the causes?  Problems with the fetal chromosomes that make it impossible for the baby to develop normally. Problems with the baby's genes or chromosomes are most often the result of errors that occur, by chance, as the embryo divides  and grows. The problems are not inherited from the parents.  Infection of the cervix or uterus.  Hormone problems.  Problems with the cervix, such as having an incompetent cervix. This is when the tissue in the cervix is not strong enough to hold the pregnancy.  Problems with the uterus, such as an abnormally shaped uterus, uterine fibroids, or congenital abnormalities.  Certain medical conditions.  Smoking, drinking alcohol, or taking illegal drugs.  Trauma. Often, the cause of a miscarriage is unknown. What are the signs or symptoms?  Vaginal bleeding or spotting, with or without cramps or pain.  Pain or cramping in the abdomen or lower back.  Passing fluid, tissue, or blood clots from the vagina. How is this diagnosed? Your caregiver will perform a physical exam. You may also have an ultrasound to confirm the miscarriage. Blood or urine tests may also be ordered. How is this treated?  Sometimes, treatment is not necessary if you naturally pass all the fetal tissue that was in the uterus. If some of the fetus or placenta remains in the body (incomplete miscarriage), tissue left behind may become infected and must be removed. Usually, a dilation and curettage (D and C) procedure is performed. During a D and C procedure, the cervix is widened (dilated) and any remaining fetal or placental tissue is gently removed from the uterus.  Antibiotic medicines are prescribed if there is an infection. Other medicines may be given to reduce the size of the uterus (contract) if there is a lot of bleeding.  If you have Rh negative blood and your baby was Rh positive, you will need a Rh immunoglobulin shot. This shot will protect any future baby from having Rh blood problems in future pregnancies. Follow these instructions at home:  Your caregiver may order bed rest or may allow you to continue light activity. Resume activity as directed by your caregiver.  Have someone help with home and  family responsibilities during this time.  Keep track of the number of sanitary pads you use each day and how soaked (saturated) they are. Write down this information.  Do not use tampons. Do not douche or have sexual intercourse until approved by your caregiver.  Only take over-the-counter or prescription medicines for pain or discomfort as directed by your caregiver.  Do not take aspirin. Aspirin can cause bleeding.  Keep all follow-up appointments with your caregiver.  If you or your partner have problems with grieving, talk to your caregiver or seek counseling to help cope with the pregnancy loss. Allow enough time to grieve before trying to get pregnant again. Get help right away if:  You have severe cramps or pain in your back or abdomen.  You have a fever.  You pass large blood clots (walnut-sized or larger) ortissue from your vagina. Save any tissue for your caregiver to inspect.  Your bleeding increases.  You have a thick, bad-smelling vaginal  discharge.  You become lightheaded, weak, or you faint.  You have chills. This information is not intended to replace advice given to you by your health care provider. Make sure you discuss any questions you have with your health care provider. Document Released: 11/24/2000 Document Revised: 11/06/2015 Document Reviewed: 07/20/2011 Elsevier Interactive Patient Education  2017 [employer].

## 2016-10-11 NOTE — H&P (Signed)
Kathleen Rice is an 26 y.o. G1P0 female.   Chief Complaint: Miscarriage HPI: Here for definitive treatment of missed Ab. Diagnosed at 17 wks by dates but only 11 5/7 wks by u/s today in [facility]. She was told to return in one week for D and C there but would like treatment earlier. She is not bleeding.  Past Medical History:  Diagnosis Date  . Peripheral neuropathy     Past Surgical History:  Procedure Laterality Date  . CHOLECYSTECTOMY      No family history on file. Social History:  reports that she has never smoked. She has never used smokeless tobacco. She reports that she does not drink alcohol or use drugs.  Allergies: No Known Allergies  No current facility-administered medications on file prior to encounter.    Current Outpatient Prescriptions on File Prior to Encounter  Medication Sig Dispense Refill  . acetaminophen (TYLENOL) 500 MG tablet Take 500 mg by mouth every 6 (six) hours as needed for mild pain or moderate pain.    . famotidine (PEPCID) 40 MG tablet Take 1 tablet (40 mg total) by mouth daily. 30 tablet 3  . IRON PO Take 1 tablet by mouth daily.    . ondansetron (ZOFRAN ODT) 4 MG disintegrating tablet Take 1 tablet (4 mg total) by mouth every 8 (eight) hours as needed for nausea or vomiting. 15 tablet 0  . Prenatal Vit-Fe Fumarate-FA (MULTIVITAMIN-PRENATAL) 27-0.8 MG TABS tablet Take 1 tablet by mouth daily at 12 noon.    . promethazine (PHENERGAN) 25 MG suppository Place 1 suppository (25 mg total) rectally every 6 (six) hours as needed for nausea. 12 suppository 1    Pertinent items are noted in HPI.  Last menstrual period 06/14/2016. General appearance: alert, cooperative and appears stated age Head: Normocephalic, without obvious abnormality, atraumatic Neck: supple, symmetrical, trachea midline Lungs: normal effort Heart: regular rate and rhythm Abdomen: soft, non-tender; bowel sounds normal; no masses,  no organomegaly Extremities: extremities  normal, atraumatic, no cyanosis or edema Skin: Skin color, texture, turgor normal. No rashes or lesions Neurologic: Grossly normal   Lab Results  Component Value Date   WBC 9.3 10/11/2016   HGB 11.7 (L) 10/11/2016   HCT 35.7 (L) 10/11/2016   MCV 82.8 10/11/2016   PLT 292 10/11/2016   Lab Results  Component Value Date   PREGTESTUR POSITIVE (A) 07/21/2016   HCG >2,000.0 (H) 07/29/2016     Assessment/Plan Principal Problem:   Missed ab  For D & C with suction Risks include but are not limited to bleeding, infection, injury to surrounding structures, including bowel, bladder and ureters, blood clots, and death.  Likelihood of success is high.    Reva Bores 10/11/2016, 10:19 PM

## 2016-10-11 NOTE — MAU Provider Note (Signed)
Patient Kathleen Rice is a 26 year old G1P0 At [redacted]w[redacted]d by LMP Here after her prenatal visit today  in [place] revealed absent fetal heart rate at 17 weeks by. Patient was told that she would be scheduled for a D and C in one week, but she is here now for a second opinion and to find out if she can have her procedure sooner.  Patient denies bleeding or cramping.  History     CSN: [id number]  Arrival date and time: 10/11/16 1535   First Provider Initiated Contact with Patient 10/11/16 1559      No chief complaint on file.  HPI  OB History    Gravida Para Term Preterm AB Living   1             SAB TAB Ectopic Multiple Live Births                  Past Medical History:  Diagnosis Date  . Peripheral neuropathy     Past Surgical History:  Procedure Laterality Date  . CHOLECYSTECTOMY      History reviewed. No pertinent family history.  Social History  Substance Use Topics  . Smoking status: Never Smoker  . Smokeless tobacco: Never Used  . Alcohol use No    Allergies: No Known Allergies  No prescriptions prior to admission.    Review of Systems  HENT: Negative.   Respiratory: Negative.   Cardiovascular: Negative.   Gastrointestinal: Negative.   Endocrine: Negative.   Genitourinary: Negative.   Musculoskeletal: Negative.    Physical Exam   Blood pressure 124/76, pulse 72, temperature 98.7 F (37.1 C), temperature source Oral, resp. rate 18, weight 71.7 kg (158 lb 1.3 oz), last menstrual period 06/14/2016, SpO2 100 %.  Physical Exam  Constitutional: She is oriented to person, place, and time. She appears well-developed.  HENT:  Head: Normocephalic.  Neck: Normal range of motion.  Respiratory: Effort normal. No respiratory distress.  GI: Soft. She exhibits no distension. There is no tenderness. There is no rebound and no guarding.  Musculoskeletal: Normal range of motion.  Neurological: She is alert and oriented to person, place, and time.  Skin: Skin is  warm and dry.  Psychiatric: She has a normal mood and affect.    MAU Course  Procedures  MDM  IV fluids, CBC, ABO   Results for orders placed or performed during the hospital encounter of 10/11/16 (from the past 24 hour(s))  CBC     Status: Abnormal   Collection Time: 10/11/16  5:25 PM  Result Value Ref Range   WBC 9.3 4.0 - 10.5 K/uL   RBC 4.31 3.87 - 5.11 MIL/uL   Hemoglobin 11.7 (L) 12.0 - 15.0 g/dL   HCT 78.2 (L) 95.6 - 21.3 %   MCV 82.8 78.0 - 100.0 fL   MCH 27.1 26.0 - 34.0 pg   MCHC 32.8 30.0 - 36.0 g/dL   RDW 08.6 57.8 - 46.9 %   Platelets 292 150 - 400 K/uL  Type and screen [facility]     Status: None (Preliminary result)   Collection Time: 10/11/16  5:30 PM  Result Value Ref Range   ABO/RH(D) A POS    Antibody Screen PENDING    Sample Expiration 10/14/2016    US Ob Comp Less 14 Wks  Result Date: 10/11/2016 CLINICAL DATA:  26 year old pregnant female presents with abdominal pain. EDC by earliest sonogram:  03/25/2017 EXAM: OBSTETRIC <14 WK ULTRASOUND TECHNIQUE: Transabdominal  ultrasound was performed for evaluation of the gestation as well as the maternal uterus and adnexal regions. COMPARISON:  07/29/2016 obstetric scan. FINDINGS: Intrauterine gestational sac: Single intrauterine gestational sac appears normal in size and position. Yolk sac:  Not Visualized. Fetus:  Visualized.  Abnormally echogenic fetal osseous structures. Fetal Cardiac Activity: Not Visualized on grayscale cine, M-mode or color Doppler ultrasound. CRL:   49  mm   11 w 5 d                  [place] EDC: 05/07/2017 Subchorionic hemorrhage:  None visualized. Maternal uterus/adnexae: Nonvisualization of the ovaries bilaterally. No adnexal masses or uterine fibroids demonstrated. IMPRESSION: 1. Single intrauterine gestation at 11 weeks 5 days by crown-rump length. Abnormally echogenic fetal osseous structures. No fetal cardiac activity. Findings are diagnostic of intrauterine fetal demise. 2. No  adnexal abnormality. These results will be called to the ordering clinician or representative by the Radiologist Assistant, and communication documented in the PACS or zVision Dashboard. Electronically Signed   By: Delbert Phenix M.D.   On: 10/11/2016 16:42     Assessment and Plan   1. Missed ab   2. Fetal demise due to miscarriage   3. IUFD at less than 20 weeks of gestation     2. Patient is tearful but understanding of the circumstances. Reassured patient that she did cause this miscarriage; offered reassurance and support the patient and FOB.  3. Dr. Shawnie Pons is at the bedside to discuss options with the patient; patient to return tomorrow at 1 pm for D and C.  4. Patient stable for discharge with IV heplock in place.  Charlesetta Garibaldi Skylier Kretschmer  10/11/2016, 6:20 PM

## 2016-10-11 NOTE — MAU Note (Signed)
Patient was at ob appointment at [facility] and "they were unable to find the heartbeat". Patient just came for second opinion was not sent by ob. Patient was told in 1 week they would do a D/C but she expresses that she doesn't want to wait a week. Denies vaginal bleeding, discharge, or pain at this time.

## 2016-10-11 NOTE — MAU Note (Signed)
Urine in lab 

## 2016-10-12 ENCOUNTER — Ambulatory Visit (HOSPITAL_COMMUNITY): Admitting: Certified Registered Nurse Anesthetist

## 2016-10-12 ENCOUNTER — Ambulatory Visit (HOSPITAL_COMMUNITY)
Admission: RE | Admit: 2016-10-12 | Discharge: 2016-10-12 | Disposition: A | Discharge status: Home / Self Care | Source: Ambulatory Visit | Attending: Obstetrics and Gynecology | Admitting: Obstetrics and Gynecology

## 2016-10-12 ENCOUNTER — Encounter (HOSPITAL_COMMUNITY): Payer: Self-pay

## 2016-10-12 ENCOUNTER — Encounter (HOSPITAL_COMMUNITY)
Admission: RE | Disposition: A | Discharge status: Home / Self Care | Payer: Self-pay | Source: Ambulatory Visit | Attending: Obstetrics and Gynecology

## 2016-10-12 DIAGNOSIS — Z3A11 11 weeks gestation of pregnancy: Secondary | ICD-10-CM | POA: Insufficient documentation

## 2016-10-12 DIAGNOSIS — K219 Gastro-esophageal reflux disease without esophagitis: Secondary | ICD-10-CM | POA: Insufficient documentation

## 2016-10-12 DIAGNOSIS — G629 Polyneuropathy, unspecified: Secondary | ICD-10-CM | POA: Diagnosis not present

## 2016-10-12 DIAGNOSIS — O021 Missed abortion: Secondary | ICD-10-CM | POA: Insufficient documentation

## 2016-10-12 HISTORY — PX: DILATION AND EVACUATION: SHX1459

## 2016-10-12 LAB — ABO/RH: ABO/RH(D): A POS

## 2016-10-12 SURGERY — DILATION AND EVACUATION, UTERUS
Anesthesia: General

## 2016-10-12 MED ORDER — OXYCODONE-ACETAMINOPHEN 5-325 MG PO TABS: 1.0000 | ORAL_TABLET | Freq: Four times a day (QID) | ORAL | 0 refills | Status: AC | PRN

## 2016-10-12 MED ORDER — DEXAMETHASONE SODIUM PHOSPHATE 10 MG/ML IJ SOLN
INTRAMUSCULAR | Status: AC
  Filled 2016-10-12: qty 1

## 2016-10-12 MED ORDER — FENTANYL CITRATE (PF) 100 MCG/2ML IJ SOLN
25.0000 ug | INTRAMUSCULAR | Status: DC | PRN
  Administered 2016-10-12 (×3): 50 ug via INTRAVENOUS

## 2016-10-12 MED ORDER — IBUPROFEN 600 MG PO TABS: 600.0000 mg | ORAL_TABLET | Freq: Four times a day (QID) | ORAL | 3 refills | Status: AC | PRN

## 2016-10-12 MED ORDER — KETOROLAC TROMETHAMINE 30 MG/ML IJ SOLN
INTRAMUSCULAR | Status: AC
  Filled 2016-10-12: qty 1

## 2016-10-12 MED ORDER — MIDAZOLAM HCL 2 MG/2ML IJ SOLN
INTRAMUSCULAR | Status: AC
  Filled 2016-10-12: qty 2

## 2016-10-12 MED ORDER — HYDROCODONE-ACETAMINOPHEN 7.5-325 MG PO TABS: 1.0000 | ORAL_TABLET | Freq: Once | ORAL | Status: DC | PRN

## 2016-10-12 MED ORDER — PROPOFOL 10 MG/ML IV BOLUS
INTRAVENOUS | Status: AC
  Filled 2016-10-12: qty 20

## 2016-10-12 MED ORDER — METOCLOPRAMIDE HCL 5 MG/ML IJ SOLN: 10.0000 mg | Freq: Once | INTRAMUSCULAR | Status: DC | PRN

## 2016-10-12 MED ORDER — LIDOCAINE HCL (CARDIAC) 20 MG/ML IV SOLN
INTRAVENOUS | Status: AC
  Filled 2016-10-12: qty 5

## 2016-10-12 MED ORDER — DOXYCYCLINE HYCLATE 100 MG IV SOLR
INTRAVENOUS | Status: DC | PRN
  Administered 2016-10-12: 100 mg via INTRAVENOUS

## 2016-10-12 MED ORDER — DEXAMETHASONE SODIUM PHOSPHATE 10 MG/ML IJ SOLN
INTRAMUSCULAR | Status: DC | PRN
  Administered 2016-10-12: 10 mg via INTRAVENOUS

## 2016-10-12 MED ORDER — LACTATED RINGERS IV SOLN: INTRAVENOUS | Status: DC

## 2016-10-12 MED ORDER — FENTANYL CITRATE (PF) 100 MCG/2ML IJ SOLN
INTRAMUSCULAR | Status: AC
  Filled 2016-10-12: qty 2

## 2016-10-12 MED ORDER — SUCCINYLCHOLINE CHLORIDE 20 MG/ML IJ SOLN
INTRAMUSCULAR | Status: DC | PRN
  Administered 2016-10-12: 100 mg via INTRAVENOUS

## 2016-10-12 MED ORDER — FENTANYL CITRATE (PF) 250 MCG/5ML IJ SOLN
INTRAMUSCULAR | Status: AC
  Filled 2016-10-12: qty 5

## 2016-10-12 MED ORDER — LIDOCAINE HCL (CARDIAC) 20 MG/ML IV SOLN
INTRAVENOUS | Status: DC | PRN
  Administered 2016-10-12: 80 mg via INTRAVENOUS

## 2016-10-12 MED ORDER — FENTANYL CITRATE (PF) 100 MCG/2ML IJ SOLN
INTRAMUSCULAR | Status: DC | PRN
  Administered 2016-10-12: 100 ug via INTRAVENOUS
  Administered 2016-10-12: 50 ug via INTRAVENOUS

## 2016-10-12 MED ORDER — SCOPOLAMINE 1 MG/3DAYS TD PT72
MEDICATED_PATCH | TRANSDERMAL | Status: AC
  Administered 2016-10-12: 1.5 mg via TRANSDERMAL
  Filled 2016-10-12: qty 1

## 2016-10-12 MED ORDER — LACTATED RINGERS IV SOLN
INTRAVENOUS | Status: DC
  Administered 2016-10-12: 125 mL/h via INTRAVENOUS

## 2016-10-12 MED ORDER — PROPOFOL 10 MG/ML IV BOLUS
INTRAVENOUS | Status: DC | PRN
  Administered 2016-10-12: 200 mg via INTRAVENOUS

## 2016-10-12 MED ORDER — DOXYCYCLINE HYCLATE 100 MG IV SOLR: 100.0000 mg | Freq: Once | INTRAVENOUS | Status: DC

## 2016-10-12 MED ORDER — DOXYCYCLINE HYCLATE 100 MG IV SOLR
100.0000 mg | Freq: Two times a day (BID) | INTRAVENOUS | Status: DC
  Filled 2016-10-12 (×4): qty 100

## 2016-10-12 MED ORDER — ONDANSETRON HCL 4 MG/2ML IJ SOLN
INTRAMUSCULAR | Status: DC | PRN
  Administered 2016-10-12: 4 mg via INTRAVENOUS

## 2016-10-12 MED ORDER — SUCCINYLCHOLINE CHLORIDE 200 MG/10ML IV SOSY
PREFILLED_SYRINGE | INTRAVENOUS | Status: AC
  Filled 2016-10-12: qty 20

## 2016-10-12 MED ORDER — KETOROLAC TROMETHAMINE 30 MG/ML IJ SOLN
INTRAMUSCULAR | Status: DC | PRN
  Administered 2016-10-12: 30 mg via INTRAVENOUS

## 2016-10-12 MED ORDER — ONDANSETRON HCL 4 MG/2ML IJ SOLN
INTRAMUSCULAR | Status: AC
  Filled 2016-10-12: qty 2

## 2016-10-12 MED ORDER — ROCURONIUM BROMIDE 100 MG/10ML IV SOLN
INTRAVENOUS | Status: AC
  Filled 2016-10-12: qty 1

## 2016-10-12 MED ORDER — MIDAZOLAM HCL 2 MG/2ML IJ SOLN
INTRAMUSCULAR | Status: DC | PRN
  Administered 2016-10-12: 2 mg via INTRAVENOUS

## 2016-10-12 MED ORDER — CHLOROPROCAINE HCL 1 % IJ SOLN
INTRAMUSCULAR | Status: DC | PRN
  Administered 2016-10-12: 10 mL

## 2016-10-12 MED ORDER — MEPERIDINE HCL 25 MG/ML IJ SOLN: 6.2500 mg | INTRAMUSCULAR | Status: DC | PRN

## 2016-10-12 MED ORDER — SCOPOLAMINE 1 MG/3DAYS TD PT72
1.0000 | MEDICATED_PATCH | Freq: Once | TRANSDERMAL | Status: DC
  Administered 2016-10-12: 1.5 mg via TRANSDERMAL

## 2016-10-12 MED ORDER — DOXYCYCLINE HYCLATE 100 MG IV SOLR: 100.0000 mg | INTRAVENOUS | Status: DC

## 2016-10-12 SURGICAL SUPPLY — 18 items
CATH ROBINSON RED A/P 16FR (CATHETERS) ×3 IMPLANT
DECANTER SPIKE VIAL GLASS SM (MISCELLANEOUS) ×3 IMPLANT
GLOVE BIOGEL PI IND STRL 6.5 (GLOVE) ×1 IMPLANT
GLOVE BIOGEL PI IND STRL 7.0 (GLOVE) ×1 IMPLANT
GLOVE BIOGEL PI INDICATOR 6.5 (GLOVE) ×2
GLOVE BIOGEL PI INDICATOR 7.0 (GLOVE) ×2
GLOVE SURG SS PI 6.0 STRL IVOR (GLOVE) ×3 IMPLANT
GOWN STRL REUS W/TWL LRG LVL3 (GOWN DISPOSABLE) ×6 IMPLANT
KIT BERKELEY 1ST TRIMESTER 3/8 (MISCELLANEOUS) ×3 IMPLANT
NS IRRIG 1000ML POUR BTL (IV SOLUTION) ×3 IMPLANT
PACK VAGINAL MINOR WOMEN LF (CUSTOM PROCEDURE TRAY) ×3 IMPLANT
PAD OB MATERNITY 4.3X12.25 (PERSONAL CARE ITEMS) ×3 IMPLANT
PAD PREP 24X48 CUFFED NSTRL (MISCELLANEOUS) ×3 IMPLANT
SET BERKELEY SUCTION TUBING (SUCTIONS) ×3 IMPLANT
TOWEL OR 17X24 6PK STRL BLUE (TOWEL DISPOSABLE) ×6 IMPLANT
VACURETTE 10 RIGID CVD (CANNULA) ×3 IMPLANT
VACURETTE 8 RIGID CVD (CANNULA) IMPLANT
VACURETTE 9 RIGID CVD (CANNULA) IMPLANT

## 2016-10-12 NOTE — Anesthesia Postprocedure Evaluation (Signed)
Anesthesia Post Note  Patient: Kathleen Rice  Procedure(s) Performed: Procedure(s) (LRB): DILATATION AND EVACUATION (N/A)  Patient location during evaluation: PACU Anesthesia Type: General Level of consciousness: awake and alert and oriented Pain management: pain level controlled Vital Signs Assessment: post-procedure vital signs reviewed and stable Respiratory status: spontaneous breathing, nonlabored ventilation and respiratory function stable Cardiovascular status: blood pressure returned to baseline and stable Postop Assessment: no signs of nausea or vomiting Anesthetic complications: no        Last Vitals:  Vitals:   10/12/16 0942 10/12/16 1245  BP: 119/74 106/67  Pulse: 84 72  Resp: 18 14  Temp: 37.2 C 36.8 C    Last Pain:  Vitals:   10/12/16 0942  TempSrc: Oral   Pain Goal: Patients Stated Pain Goal: 5 (10/12/16 0942)               Merideth Bosque A.

## 2016-10-12 NOTE — Op Note (Signed)
Kathleen Rice PROCEDURE DATE: 10/12/2016  PREOPERATIVE DIAGNOSIS: 11 week missed abortion. POSTOPERATIVE DIAGNOSIS: The same. PROCEDURE:     Dilation and Evacuation. SURGEON:  Dr. Catalina Antigua  INDICATIONS: 26 y.o. G1P0 with MAB at [redacted] weeks gestation, needing surgical completion.  Risks of surgery were discussed with the patient including but not limited to: bleeding which may require transfusion; infection which may require antibiotics; injury to uterus or surrounding organs;need for additional procedures including laparotomy or laparoscopy; possibility of intrauterine scarring which may impair future fertility; and other postoperative/anesthesia complications. Written informed consent was obtained.    FINDINGS:  A 10-week size midline uterus, moderate amounts of products of conception, specimen sent to pathology.  ANESTHESIA:    Monitored intravenous sedation, paracervical block. INTRAVENOUS FLUIDS:  800 ml of LR ESTIMATED BLOOD LOSS:  Less than 20 ml. SPECIMENS:  Products of conception sent to pathology COMPLICATIONS:  None immediate.  PROCEDURE DETAILS:  The patient received intravenous antibiotics while in the preoperative area.  She was then taken to the operating room where general anesthesia was administered and was found to be adequate.  After an adequate timeout was performed, she was placed in the dorsal lithotomy position and examined; then prepped and draped in the sterile manner.   Her bladder was catheterized for an unmeasured amount of clear, yellow urine. A vaginal speculum was then placed in the patient's vagina and a single tooth tenaculum was applied to the anterior lip of the cervix.  A paracervical block using 0.5% Marcaine was administered. The cervix was gently dilated to accommodate a 10 mm suction curette that was gently advanced to the uterine fundus.  The suction device was then activated and curette slowly rotated to clear the uterus of products of conception.  A  sharp curettage was then performed to confirm complete emptying of the uterus. There was minimal bleeding noted and the tenaculum removed with good hemostasis noted.   All instruments were removed from the patient's vagina. The patient tolerated the procedure well and was taken to the recovery area awake, and in stable condition.  The patient will be discharged to home as per PACU criteria.  Routine postoperative instructions given.  She was prescribed Percocet, Ibuprofen and Colace.  She will follow up in the clinic in 2 weeks for postoperative evaluation.

## 2016-10-12 NOTE — Discharge Instructions (Addendum)
Dilation and Curettage or Vacuum Curettage Dilation and curettage (D&C) and vacuum curettage are minor procedures. A D&C involves stretching (dilation) the cervix and scraping (curettage) the inside lining of the uterus (endometrium). During a D&C, tissue is gently scraped from the endometrium, starting from the top portion of the uterus down to the lowest part of the uterus (cervix). During a vacuum curettage, the lining and tissue in the uterus are removed with the use of gentle suction. Curettage may be performed to either diagnose or treat a problem. As a diagnostic procedure, curettage is performed to examine tissues from the uterus. A diagnostic curettage may be done if you have:  Irregular bleeding in the uterus.  Bleeding with the development of clots.  Spotting between menstrual periods.  Prolonged menstrual periods or other abnormal bleeding.  Bleeding after menopause.  No menstrual period (amenorrhea).  A change in size and shape of the uterus.  Abnormal endometrial cells discovered during a Pap test. As a treatment procedure, curettage may be performed for the following reasons:  Removal of an IUD (intrauterine device).  Removal of retained placenta after giving birth.  Abortion.  Miscarriage.  Removal of endometrial polyps.  Removal of uncommon types of noncancerous lumps (fibroids). Tell a health care provider about:  Any allergies you have, including allergies to prescribed medicine or latex.  All medicines you are taking, including vitamins, herbs, eye drops, creams, and over-the-counter medicines. This is especially important if you take any blood-thinning medicine. Bring a list of all of your medicines to your appointment.  Any problems you or family members have had with anesthetic medicines.  Any blood disorders you have.  Any surgeries you have had.  Your medical history and any medical conditions you have.  Whether you are pregnant or may be  pregnant.  Recent vaginal infections you have had.  Recent menstrual periods, bleeding problems you have had, and what form of birth control (contraception) you use. What are the risks? Generally, this is a safe procedure. However, problems may occur, including:  Infection.  Heavy vaginal bleeding.  Allergic reactions to medicines.  Damage to the cervix or other structures or organs.  Development of scar tissue (adhesions) inside the uterus, which can cause abnormal amounts of menstrual bleeding. This may make it harder to get pregnant in the future.  A hole (perforation) or puncture in the uterine wall. This is rare. What happens before the procedure? Staying hydrated  Follow instructions from your health care provider about hydration, which may include:  Up to 2 hours before the procedure - you may continue to drink clear liquids, such as water, clear fruit juice, black coffee, and plain tea. Eating and drinking restrictions  Follow instructions from your health care provider about eating and drinking, which may include:  8 hours before the procedure - stop eating heavy meals or foods such as meat, fried foods, or fatty foods.  6 hours before the procedure - stop eating light meals or foods, such as toast or cereal.  6 hours before the procedure - stop drinking milk or drinks that contain milk.  2 hours before the procedure - stop drinking clear liquids. If your health care provider told you to take your medicine(s) on the day of your procedure, take them with only a sip of water. Medicines   Ask your health care provider about:  Changing or stopping your regular medicines. This is especially important if you are taking diabetes medicines or blood thinners.  Taking medicines such  as aspirin and ibuprofen. These medicines can thin your blood. Do not take these medicines before your procedure if your health care provider instructs you not to.  You may be given antibiotic  medicine to help prevent infection. General instructions   For 24 hours before your procedure, do not:  [place].  Use tampons.  Use medicines, creams, or suppositories in the vagina.  Have sexual intercourse.  You may be given a pregnancy test on the day of the procedure.  Plan to have someone take you home from the hospital or clinic.  You may have a blood or urine sample taken.  If you will be going home right after the procedure, plan to have someone with you for 24 hours. What happens during the procedure?  To reduce your risk of infection:  Your health care team will wash or sanitize their hands.  Your skin will be washed with soap.  An IV tube will be inserted into one of your veins.  You will be given one of the following:  A medicine that numbs the area in and around the cervix (local anesthetic).  A medicine to make you fall asleep (general anesthetic).  You will lie down on your back, with your feet in foot rests (stirrups).  The size and position of your uterus will be checked.  A lubricated instrument (speculum or Sims retractor) will be inserted into the back side of your vagina. The speculum will be used to hold apart the walls of your vagina so your health care provider can see your cervix.  A tool (tenaculum) will be attached to the lip of the cervix to stabilize it.  Your cervix will be softened and dilated. This may be done by:  Taking a medicine.  Having tapered dilators or thin rods (laminaria) or gradual widening instruments (tapered dilators) inserted into your cervix.  A small, sharp, curved instrument (curette) will be used to scrape a small amount of tissue or cells from the endometrium or cervical canal. In some cases, gentle suction is applied with the curette. The curette will then be removed. The cells will be taken to a lab for testing. The procedure may vary among health care providers and hospitals. What happens after the  procedure?  You may have mild cramping, backache, pain, and light bleeding or spotting. You may pass small blood clots from your vagina.  You may have to wear compression stockings. These stockings help to prevent blood clots and reduce swelling in your legs.  Your blood pressure, heart rate, breathing rate, and blood oxygen level will be monitored until the medicines you were given have worn off. Summary  Dilation and curettage (D&C) involves stretching (dilation) the cervix and scraping (curettage) the inside lining of the uterus (endometrium).  After the procedure, you may have mild cramping, backache, pain, and light bleeding or spotting. You may pass small blood clots from your vagina.  Plan to have someone take you home from the hospital or clinic. This information is not intended to replace advice given to you by your health care provider. Make sure you discuss any questions you have with your health care provider. Document Released: 05/31/2005 Document Revised: 02/15/2016 Document Reviewed: 02/15/2016 Elsevier Interactive Patient Education  2017 [employer]  Post Anesthesia Home Care Instructions  No ibuprofen products until: 5:15 today  Activity: Get plenty of rest for the remainder of the day. A responsible individual must stay with you for 24 hours following the procedure.  For the next  24 hours, DO NOT: -Drive a car -Advertising copywriter -Drink alcoholic beverages -Take any medication unless instructed by your physician -Make any legal decisions or sign important papers.  Meals: Start with liquid foods such as gelatin or soup. Progress to regular foods as tolerated. Avoid greasy, spicy, heavy foods. If nausea and/or vomiting occur, drink only clear liquids until the nausea and/or vomiting subsides. Call your physician if vomiting continues.  Special Instructions/Symptoms: Your throat may feel dry or sore from the anesthesia or the breathing tube placed in your throat  during surgery. If this causes discomfort, gargle with warm salt water. The discomfort should disappear within 24 hours.  If you had a scopolamine patch placed behind your ear for the management of post- operative nausea and/or vomiting:  1. The medication in the patch is effective for 72 hours, after which it should be removed.  Wrap patch in a tissue and discard in the trash. Wash hands thoroughly with soap and water. 2. You may remove the patch earlier than 72 hours if you experience unpleasant side effects which may include dry mouth, dizziness or visual disturbances. 3. Avoid touching the patch. Wash your hands with soap and water after contact with the patch.

## 2016-10-12 NOTE — Transfer of Care (Signed)
Immediate Anesthesia Transfer of Care Note  Patient: Kathleen Rice  Procedure(s) Performed: Procedure(s): DILATATION AND EVACUATION (N/A)  Patient Location: PACU  Anesthesia Type:General  Level of Consciousness: awake, alert  and oriented  Airway & Oxygen Therapy: Patient Spontanous Breathing and Patient connected to nasal cannula oxygen  Post-op Assessment: Report given to RN, Post -op Vital signs reviewed and stable and Patient moving all extremities  Post vital signs: Reviewed and stable  Last Vitals:  Vitals:   10/12/16 0942  BP: 119/74  Pulse: 84  Resp: 18  Temp: 37.2 C    Last Pain:  Vitals:   10/12/16 0942  TempSrc: Oral      Patients Stated Pain Goal: 5 (10/12/16 0942)  Complications: No apparent anesthesia complications

## 2016-10-12 NOTE — Anesthesia Procedure Notes (Signed)
Procedure Name: Intubation Date/Time: 10/12/2016 11:08 AM Performed by: Hewitt Blade Pre-anesthesia Checklist: Patient identified, Emergency Drugs available, Suction available and Patient being monitored Patient Re-evaluated:Patient Re-evaluated prior to inductionOxygen Delivery Method: Circle system utilized Preoxygenation: Pre-oxygenation with 100% oxygen Intubation Type: IV induction, Cricoid Pressure applied and Rapid sequence Laryngoscope Size: Mac and 3 Grade View: Grade I Tube size: 7.0 mm Number of attempts: 1 Airway Equipment and Method: Stylet Placement Confirmation: ETT inserted through vocal cords under direct vision,  positive ETCO2 and breath sounds checked- equal and bilateral Secured at: 20 cm Tube secured with: Tape Dental Injury: Teeth and Oropharynx as per pre-operative assessment

## 2016-10-12 NOTE — Anesthesia Preprocedure Evaluation (Signed)
Anesthesia Evaluation  Patient identified by MRN, date of birth, ID band Patient awake    Reviewed: Allergy & Precautions, NPO status , Patient's Chart, lab work & pertinent test results  Airway Mallampati: II  TM Distance: >3 FB Neck ROM: Full    Dental no notable dental hx. (+) Teeth Intact   Pulmonary neg pulmonary ROS,    Pulmonary exam normal breath sounds clear to auscultation       Cardiovascular negative cardio ROS Normal cardiovascular exam Rhythm:Regular Rate:Normal     Neuro/Psych Hx/o peripheral neuropathy- resolved  Neuromuscular disease negative psych ROS   GI/Hepatic Neg liver ROS, GERD  Medicated and Controlled,  Endo/Other  negative endocrine ROS  Renal/GU negative Renal ROS  negative genitourinary   Musculoskeletal negative musculoskeletal ROS (+)   Abdominal   Peds  Hematology  (+) anemia ,   Anesthesia Other Findings   Reproductive/Obstetrics (+) Pregnancy Missed Ab- 13 weeks                             Anesthesia Physical Anesthesia Plan  ASA: II  Anesthesia Plan: General   Post-op Pain Management:    Induction: Intravenous  Airway Management Planned: Oral ETT  Additional Equipment:   Intra-op Plan:   Post-operative Plan: Extubation in OR  Informed Consent: I have reviewed the patients History and Physical, chart, labs and discussed the procedure including the risks, benefits and alternatives for the proposed anesthesia with the patient or authorized representative who has indicated his/her understanding and acceptance.   Dental advisory given  Plan Discussed with: CRNA, Anesthesiologist and Surgeon  Anesthesia Plan Comments:         Anesthesia Quick Evaluation

## 2016-10-12 NOTE — Interval H&P Note (Signed)
History and Physical Interval Note:  10/12/2016 9:46 AM  Kathleen Rice  has presented today for surgery, with the diagnosis of missed AB at 11.5 weeks by [place]  The various methods of treatment have been discussed with the patient and family. After consideration of risks, benefits and other options for treatment, the patient has consented to  Procedure(s): DILATATION AND EVACUATION (N/A) as a surgical intervention .  The patient's history has been reviewed, patient examined, no change in status, stable for surgery.  I have reviewed the patient's chart and labs.  Risks,benefits and alternatives were explained including but not limited to risks of bleeding, infection, uterine perforation, and damage to adjacent organs. Questions were answered to the patient's satisfaction.     Martinez Boxx

## 2016-10-13 ENCOUNTER — Encounter (HOSPITAL_COMMUNITY): Payer: Self-pay | Admitting: Obstetrics and Gynecology

## 2016-10-14 ENCOUNTER — Telehealth: Payer: Self-pay

## 2016-10-14 NOTE — Telephone Encounter (Signed)
Called left message for pt to call office for her post op appointment.

## 2016-10-28 ENCOUNTER — Encounter: Admitting: Obstetrics and Gynecology

## 2017-01-21 ENCOUNTER — Encounter (HOSPITAL_COMMUNITY): Payer: Self-pay | Admitting: Nurse Practitioner

## 2017-01-21 DIAGNOSIS — R112 Nausea with vomiting, unspecified: Secondary | ICD-10-CM | POA: Insufficient documentation

## 2017-01-21 DIAGNOSIS — R197 Diarrhea, unspecified: Secondary | ICD-10-CM | POA: Diagnosis not present

## 2017-01-21 LAB — CBC
HEMATOCRIT: 37 % (ref 36.0–46.0)
HEMOGLOBIN: 12.3 g/dL (ref 12.0–15.0)
MCH: 26.9 pg (ref 26.0–34.0)
MCHC: 33.2 g/dL (ref 30.0–36.0)
MCV: 81 fL (ref 78.0–100.0)
Platelets: 328 10*3/uL (ref 150–400)
RBC: 4.57 MIL/uL (ref 3.87–5.11)
RDW: 13 % (ref 11.5–15.5)
WBC: 8.8 10*3/uL (ref 4.0–10.5)

## 2017-01-21 LAB — COMPREHENSIVE METABOLIC PANEL
ALT: 13 U/L — ABNORMAL LOW (ref 14–54)
ANION GAP: 7 (ref 5–15)
AST: 19 U/L (ref 15–41)
Albumin: 3.8 g/dL (ref 3.5–5.0)
Alkaline Phosphatase: 56 U/L (ref 38–126)
BUN: 13 mg/dL (ref 6–20)
CHLORIDE: 106 mmol/L (ref 101–111)
CO2: 28 mmol/L (ref 22–32)
Calcium: 9.7 mg/dL (ref 8.9–10.3)
Creatinine, Ser: 0.71 mg/dL (ref 0.44–1.00)
GFR calc Af Amer: >60 mL/min (ref >60)
Glucose, Bld: 92 mg/dL (ref 65–99)
POTASSIUM: 4.2 mmol/L (ref 3.5–5.1)
Sodium: 141 mmol/L (ref 135–145)
Total Bilirubin: 0.4 mg/dL (ref 0.3–1.2)
Total Protein: 7.2 g/dL (ref 6.5–8.1)

## 2017-01-21 LAB — LIPASE, BLOOD: LIPASE: 28 U/L (ref 11–51)

## 2017-01-21 MED ORDER — ONDANSETRON 4 MG PO TBDP
4.0000 mg | ORAL_TABLET | Freq: Once | ORAL | Status: AC
  Administered 2017-01-21: 4 mg via ORAL
  Filled 2017-01-21: qty 1

## 2017-01-21 NOTE — ED Triage Notes (Signed)
Pt states "there is a stomach bug going around in her house. While everyone else seem to have overcome it in 2 days, hers is still going on. She is c/o N/V/D. Denies abdominal pain.

## 2017-01-22 ENCOUNTER — Emergency Department (HOSPITAL_COMMUNITY)
Admission: EM | Admit: 2017-01-22 | Discharge: 2017-01-22 | Disposition: A | Discharge status: Home / Self Care | Attending: Emergency Medicine | Admitting: Emergency Medicine

## 2017-01-22 DIAGNOSIS — R197 Diarrhea, unspecified: Secondary | ICD-10-CM

## 2017-01-22 DIAGNOSIS — R112 Nausea with vomiting, unspecified: Secondary | ICD-10-CM

## 2017-01-22 LAB — URINALYSIS, ROUTINE W REFLEX MICROSCOPIC
BILIRUBIN URINE: NEGATIVE
GLUCOSE, UA: NEGATIVE mg/dL
Hgb urine dipstick: NEGATIVE
KETONES UR: NEGATIVE mg/dL
Leukocytes, UA: NEGATIVE
Nitrite: NEGATIVE
PH: 5 (ref 5.0–8.0)
Protein, ur: NEGATIVE mg/dL
Specific Gravity, Urine: 1.026 (ref 1.005–1.030)

## 2017-01-22 MED ORDER — ONDANSETRON 4 MG PO TBDP: 4.0000 mg | ORAL_TABLET | Freq: Three times a day (TID) | ORAL | 0 refills | Status: AC | PRN

## 2017-01-22 NOTE — Discharge Instructions (Signed)
Use Zofran to control nausea as directed. Push fluids. Recommend Imodium if having more than 5 bowel movements daily. Return to the emergency room with any high fever, severe pain or any blood in your vomiting or diarrhea.

## 2017-01-22 NOTE — ED Provider Notes (Signed)
[facility]-EMERGENCY DEPT Provider Note   CSN: [id number] Arrival date & time: 01/21/17  2110     History   Chief Complaint Chief Complaint  Patient presents with  . Emesis  . Diarrhea    HPI Kathleen Rice is a 26 y.o. female.  Patient presents with complaint of nausea, vomiting and diarrhea for the past 2 days. No hematemesis or bloody stools. No fever. She states multiple family members at home with similar symptoms. She has intermittent, infrequent, generalized abdominal cramping, usually associated with a bowel movement. Otherwise she has no pain. No cough, chest pain, dysuria, vaginal discharge.    The history is provided by the patient. No language interpreter was used.  Emesis   Associated symptoms include abdominal pain and diarrhea. Pertinent negatives include no chills, no fever and no myalgias.  Diarrhea   Associated symptoms include abdominal pain and vomiting. Pertinent negatives include no chills and no myalgias.    Past Medical History:  Diagnosis Date  . Peripheral neuropathy     Patient Active Problem List   Diagnosis Date Noted  . Missed ab 10/11/2016    Past Surgical History:  Procedure Laterality Date  . CHOLECYSTECTOMY    . DILATION AND EVACUATION N/A 10/12/2016   Procedure: DILATATION AND EVACUATION;  Surgeon: Catalina Antigua, MD;  Location: [facility];  Service: Gynecology;  Laterality: N/A;    OB History    Gravida Para Term Preterm AB Living   1             SAB TAB Ectopic Multiple Live Births                   Home Medications    Prior to Admission medications   Medication Sig Start Date End Date Taking? Authorizing Provider  Prenatal Vit-Fe Fumarate-FA (MULTIVITAMIN-PRENATAL) 27-0.8 MG TABS tablet Take 1 tablet by mouth daily at 12 noon.   Yes [provider]  famotidine (PEPCID) 40 MG tablet Take 1 tablet (40 mg total) by mouth daily. Patient not taking: Reported on 01/22/2017 08/31/16   Armando Reichert, CNM  ibuprofen  (ADVIL,MOTRIN) 600 MG tablet Take 1 tablet (600 mg total) by mouth every 6 (six) hours as needed. Patient not taking: Reported on 01/22/2017 10/12/16   Constant, Peggy, MD  ondansetron (ZOFRAN ODT) 4 MG disintegrating tablet Take 1 tablet (4 mg total) by mouth every 8 (eight) hours as needed for nausea or vomiting. 01/22/17   Elpidio Anis, PA-C  oxyCODONE-acetaminophen (PERCOCET/ROXICET) 5-325 MG tablet Take 1-2 tablets by mouth every 6 (six) hours as needed. Patient not taking: Reported on 01/22/2017 10/12/16   Constant, Gigi Gin, MD    Family History History reviewed. No pertinent family history.  Social History Social History  Substance Use Topics  . Smoking status: Never Smoker  . Smokeless tobacco: Never Used  . Alcohol use No     Allergies   Patient has no known allergies.   Review of Systems Review of Systems  Constitutional: Negative for chills and fever.  Respiratory: Negative.   Cardiovascular: Negative.   Gastrointestinal: Positive for abdominal pain, diarrhea and vomiting.  Musculoskeletal: Negative.  Negative for myalgias.  Skin: Negative.   Neurological: Negative.  Negative for syncope and weakness.     Physical Exam Updated Vital Signs BP 104/77 (BP Location: Left Arm)   Pulse 77   Temp 98 F (36.7 C) (Oral)   Resp 17   Wt 70.3 kg (155 lb)   LMP 06/14/2016   SpO2 100%  BMI 27.46 kg/m   Physical Exam  Constitutional: She is oriented to person, place, and time. She appears well-developed and well-nourished.  HENT:  Head: Normocephalic.  Neck: Normal range of motion. Neck supple.  Cardiovascular: Normal rate and regular rhythm.   Pulmonary/Chest: Effort normal and breath sounds normal. She has no wheezes. She has no rales.  Abdominal: Soft. Bowel sounds are normal. There is no tenderness. There is no rebound and no guarding.  Musculoskeletal: Normal range of motion.  Neurological: She is alert and oriented to person, place, and time.  Skin: Skin is warm  and dry. No rash noted.  Psychiatric: She has a normal mood and affect.     ED Treatments / Results  Labs (all labs ordered are listed, but only abnormal results are displayed) Labs Reviewed  COMPREHENSIVE METABOLIC PANEL - Abnormal; Notable for the following:       Result Value   ALT 13 (*)    All other components within normal limits  URINALYSIS, ROUTINE W REFLEX MICROSCOPIC - Abnormal; Notable for the following:    APPearance HAZY (*)    All other components within normal limits  LIPASE, BLOOD  CBC   Results for orders placed or performed during the hospital encounter of 01/22/17  Lipase, blood  Result Value Ref Range   Lipase 28 11 - 51 U/L  Comprehensive metabolic panel  Result Value Ref Range   Sodium 141 135 - 145 mmol/L   Potassium 4.2 3.5 - 5.1 mmol/L   Chloride 106 101 - 111 mmol/L   CO2 28 22 - 32 mmol/L   Glucose, Bld 92 65 - 99 mg/dL   BUN 13 6 - 20 mg/dL   Creatinine, Ser 1.61 0.44 - 1.00 mg/dL   Calcium 9.7 8.9 - 09.6 mg/dL   Total Protein 7.2 6.5 - 8.1 g/dL   Albumin 3.8 3.5 - 5.0 g/dL   AST 19 15 - 41 U/L   ALT 13 (L) 14 - 54 U/L   Alkaline Phosphatase 56 38 - 126 U/L   Total Bilirubin 0.4 0.3 - 1.2 mg/dL   GFR calc non Af Amer >60 >60 mL/min   GFR calc Af Amer >60 >60 mL/min   Anion gap 7 5 - 15  CBC  Result Value Ref Range   WBC 8.8 4.0 - 10.5 K/uL   RBC 4.57 3.87 - 5.11 MIL/uL   Hemoglobin 12.3 12.0 - 15.0 g/dL   HCT 04.5 40.9 - 81.1 %   MCV 81.0 78.0 - 100.0 fL   MCH 26.9 26.0 - 34.0 pg   MCHC 33.2 30.0 - 36.0 g/dL   RDW 91.4 78.2 - 95.6 %   Platelets 328 150 - 400 K/uL  Urinalysis, Routine w reflex microscopic  Result Value Ref Range   Color, Urine YELLOW YELLOW   APPearance HAZY (A) CLEAR   Specific Gravity, Urine 1.026 1.005 - 1.030   pH 5.0 5.0 - 8.0   Glucose, UA NEGATIVE NEGATIVE mg/dL   Hgb urine dipstick NEGATIVE NEGATIVE   Bilirubin Urine NEGATIVE NEGATIVE   Ketones, ur NEGATIVE NEGATIVE mg/dL   Protein, ur NEGATIVE NEGATIVE  mg/dL   Nitrite NEGATIVE NEGATIVE   Leukocytes, UA NEGATIVE NEGATIVE    EKG  EKG Interpretation None       Radiology No results found.  Procedures Procedures (including critical care time)  Medications Ordered in ED Medications  ondansetron (ZOFRAN-ODT) disintegrating tablet 4 mg (4 mg Oral Given 01/21/17 2156)     Initial Impression /  Assessment and Plan / ED Course  I have reviewed the triage vital signs and the nursing notes.  Pertinent labs & imaging results that were available during my care of the patient were reviewed by me and considered in my medical decision making (see chart for details).     Patient with symptoms of N, V, D, exposed to same by multiple family members. No vomiting in the ED. She received Zofran on arrival and reports she feels much better. She is tolerating PO fluids without vomiting.   VSS. She can be discharged home with Zofran. Recommend PCP follow up prn.  Final Clinical Impressions(s) / ED Diagnoses   Final diagnoses:  Nausea vomiting and diarrhea    New Prescriptions New Prescriptions   ONDANSETRON (ZOFRAN ODT) 4 MG DISINTEGRATING TABLET    Take 1 tablet (4 mg total) by mouth every 8 (eight) hours as needed for nausea or vomiting.     Elpidio AnisUpstill, Merrick Feutz, PA-C 01/22/17 0505    Dione BoozeGlick, David, MD 01/22/17 [phone]

## 2017-06-13 ENCOUNTER — Encounter (HOSPITAL_COMMUNITY): Payer: Self-pay

## 2018-03-26 IMAGING — US US OB COMP LESS 14 WK
1 series · 15 of 28 positions shown · non-contrast
Comparison: 07/29/2016 obstetric scan.

CLINICAL DATA: 25-year-old pregnant female presents with abdominal
pain.

EDC by earliest sonogram:  03/25/2017
EXAM:
OBSTETRIC <14 WK ULTRASOUND
TECHNIQUE: Transabdominal ultrasound was performed for evaluation of the
gestation as well as the maternal uterus and adnexal regions.

[Series 1: us ob comp less 14 wk · 31 acquisitions, 15 frames shown]
[im 1/31]
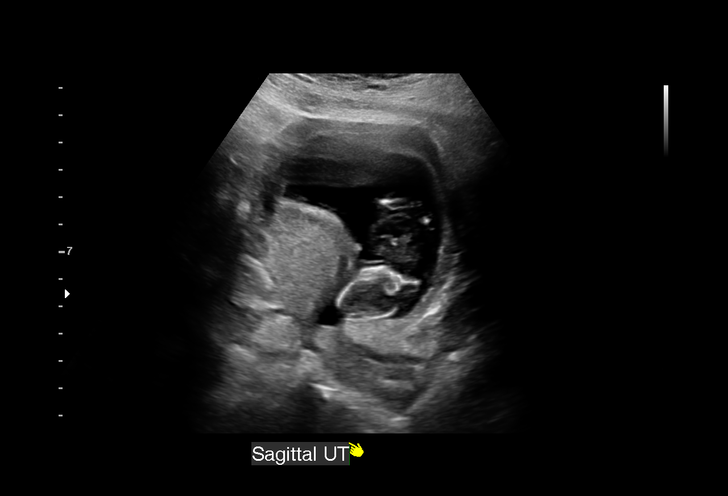
[im 3/31]
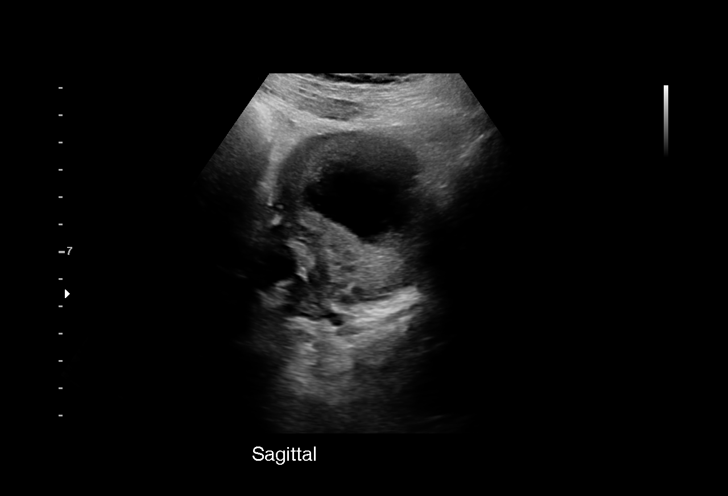
[im 5/31]
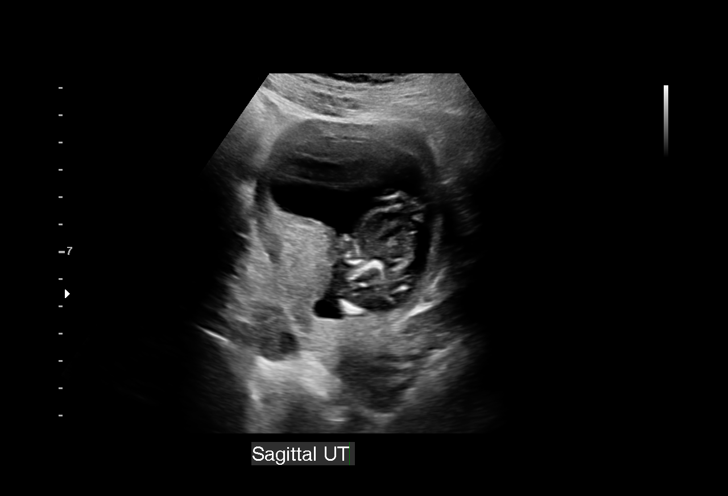
[im 7/31]
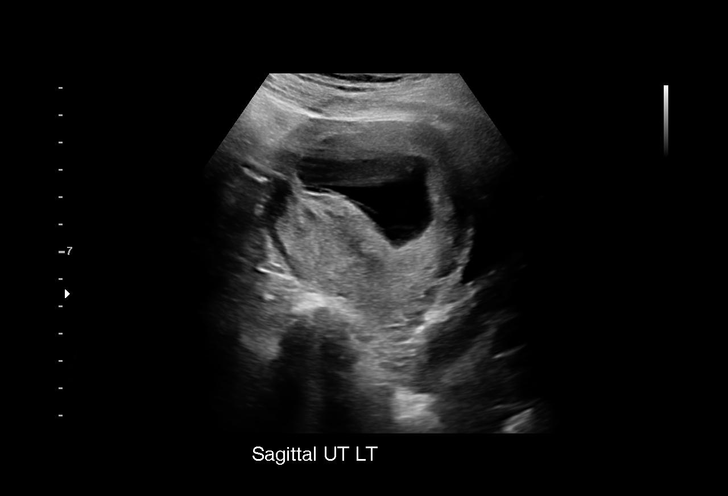
[im 9/31]
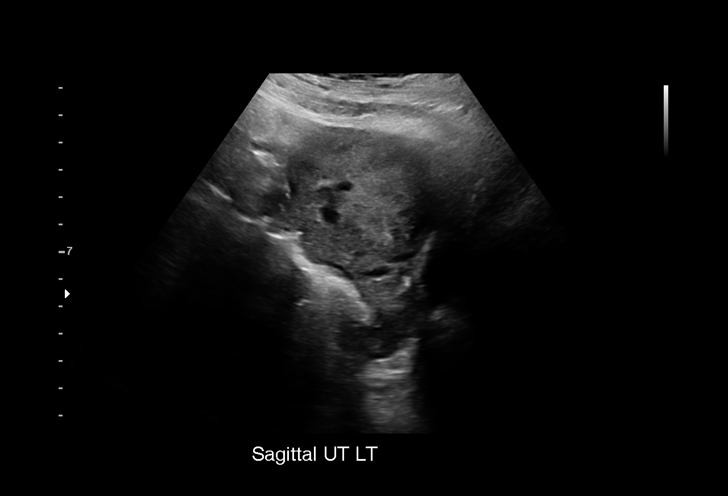
[im 12/31]
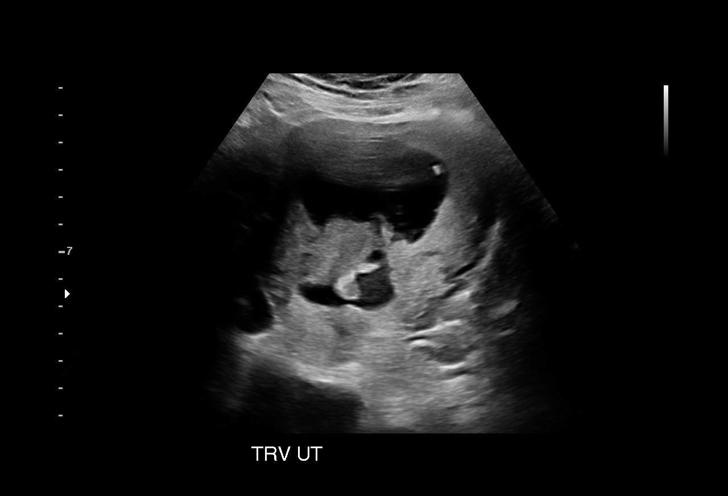
[im 14/31]
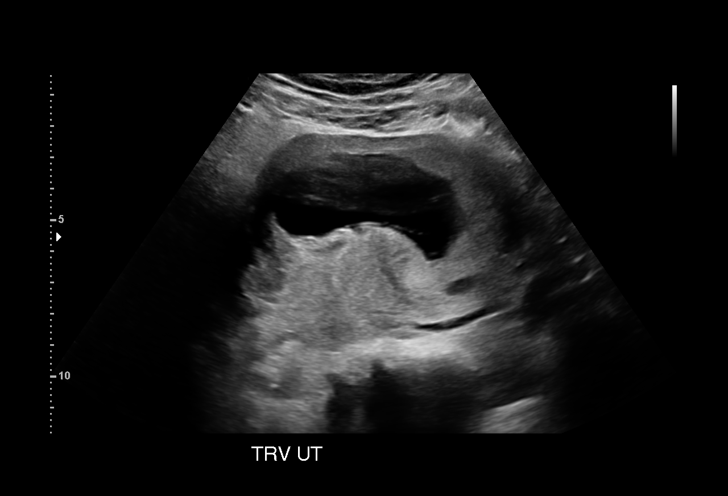
[im 16/31]
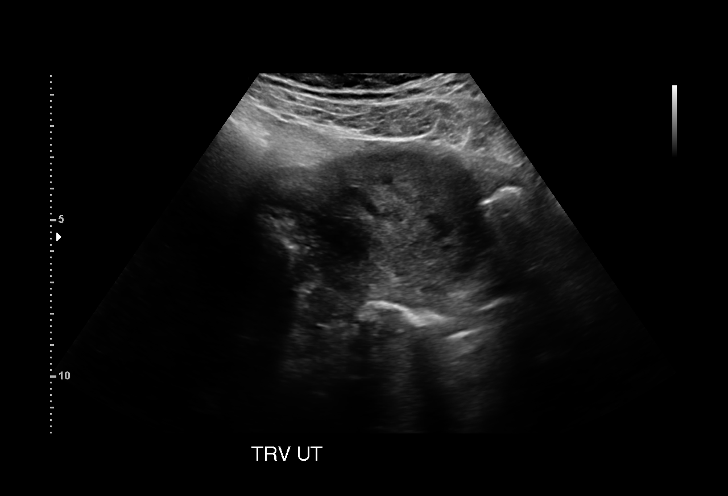
[im 17/31]
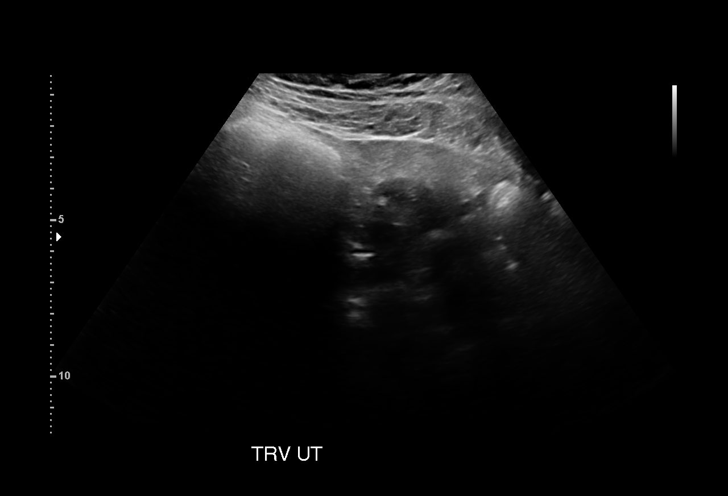
[im 19/31]
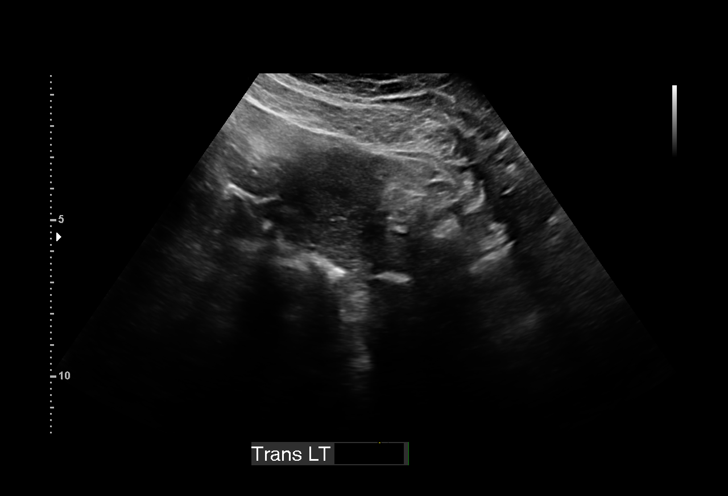
[im 22/31]
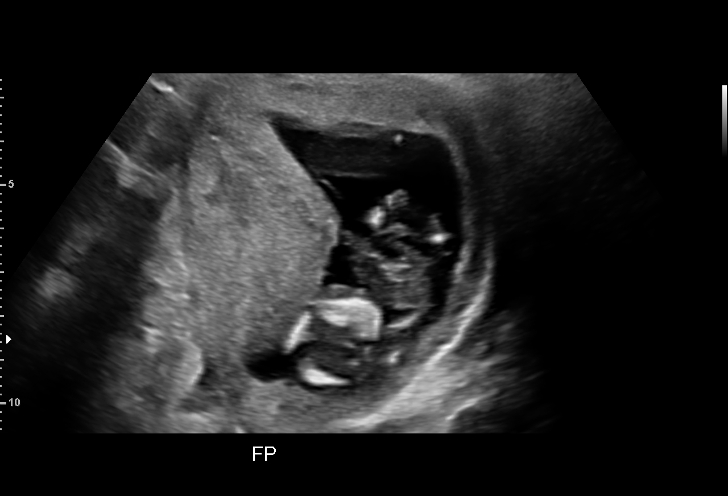
[im 24/31]
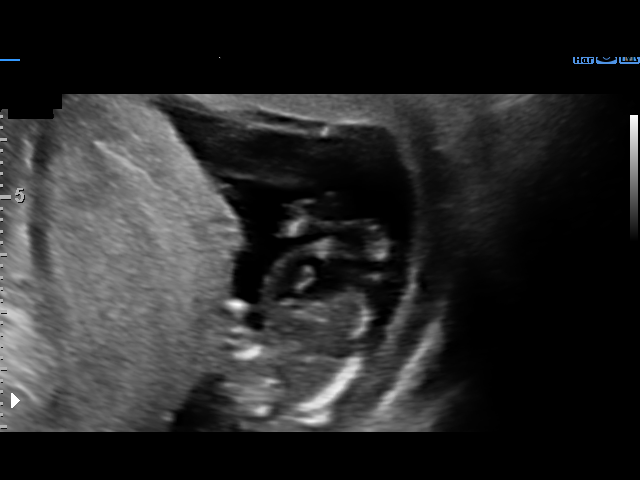
[im 26/31]
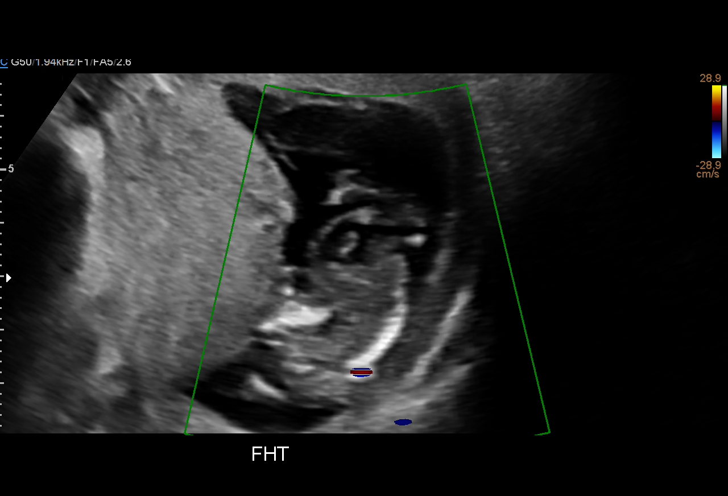
[im 28/31]
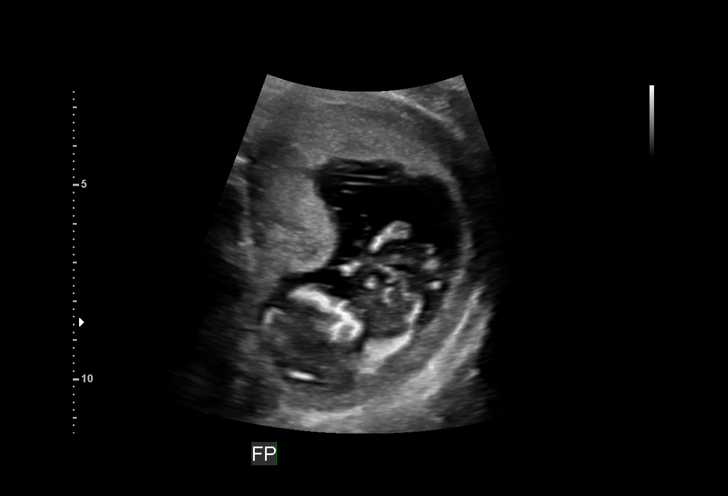
[im 31/31]
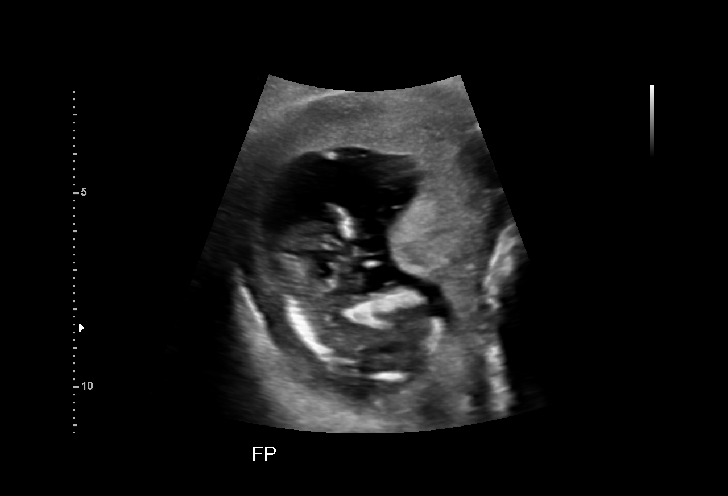

[15 of 28 positions shown; findings below may reference images not displayed]

FINDINGS: Intrauterine gestational sac: Single intrauterine gestational sac
appears normal in size and position.

Yolk sac:  Not Visualized.

Fetus:  Visualized.  Abnormally echogenic fetal osseous structures.

Fetal Cardiac Activity: Not Visualized on grayscale cine, M-mode or
color Doppler ultrasound.

CRL:   49  mm   11 w 5 d                  US EDC: 05/07/2017

Subchorionic hemorrhage:  None visualized.

Maternal uterus/adnexae: Nonvisualization of the ovaries
bilaterally. No adnexal masses or uterine fibroids demonstrated.
IMPRESSION: 1. Single intrauterine gestation at 11 weeks 5 days by crown-rump
length. Abnormally echogenic fetal osseous structures. No fetal
cardiac activity. Findings are diagnostic of intrauterine fetal
demise.
2. No adnexal abnormality.

These results will be called to the ordering clinician or
representative by the Radiologist Assistant, and communication
documented in the PACS or zVision Dashboard.

## 2019-01-29 IMAGING — US US OB COMP LESS 14 WK
1 series · 13 of 28 positions shown · non-contrast
Comparison: Pelvis ultrasound 01/01/2013

CLINICAL DATA: 25-year-old female with vaginal bleeding and
cramping for 5 days and positive urine pregnancy test. Initial
encounter.

EXAM:
OBSTETRIC <14 WK US AND TRANSVAGINAL OB US
TECHNIQUE: Both transabdominal and transvaginal ultrasound examinations were
performed for complete evaluation of the gestation as well as the
maternal uterus, adnexal regions, and pelvic cul-de-sac.
Transvaginal technique was performed to assess early pregnancy.

[Series 1: us ob comp less 14 wk · 0.22mm/px · 13 of 98 slices shown]
[im 4/98]
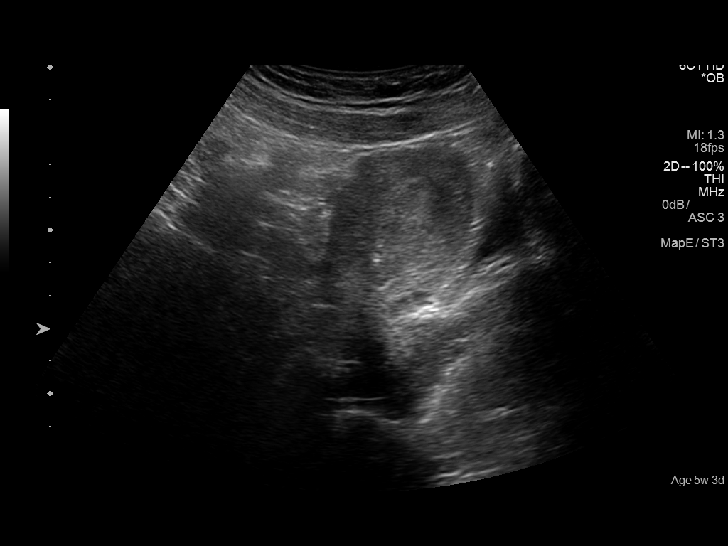
[im 11/98]
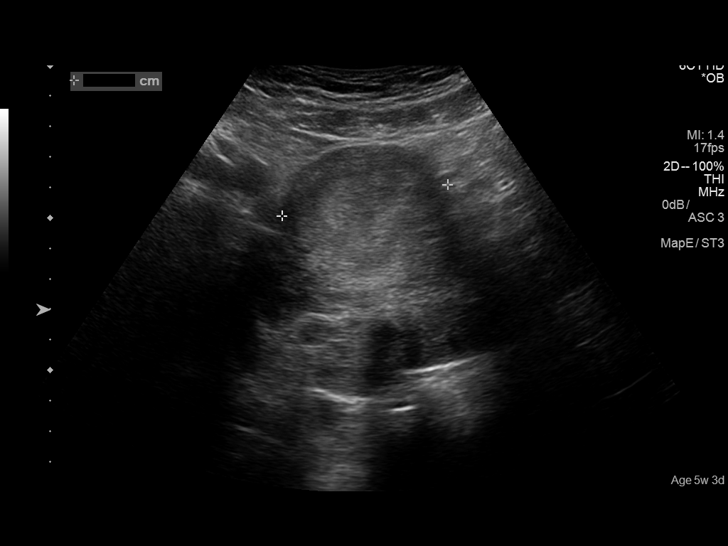
[im 18/98]
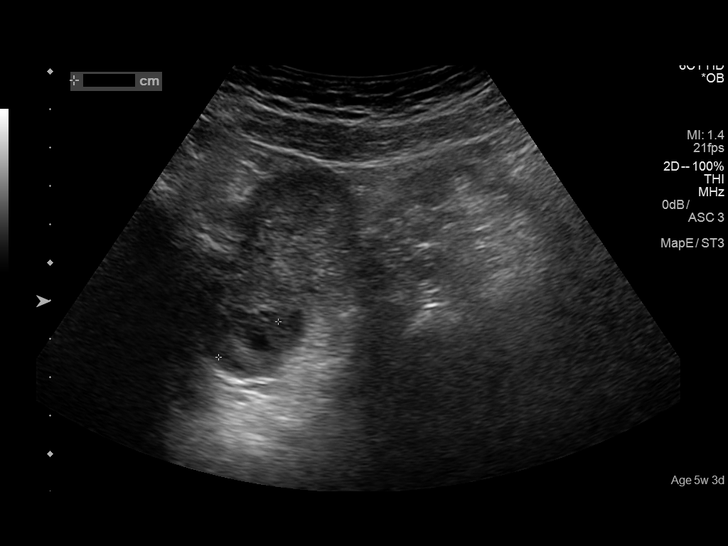
[im 26/98]
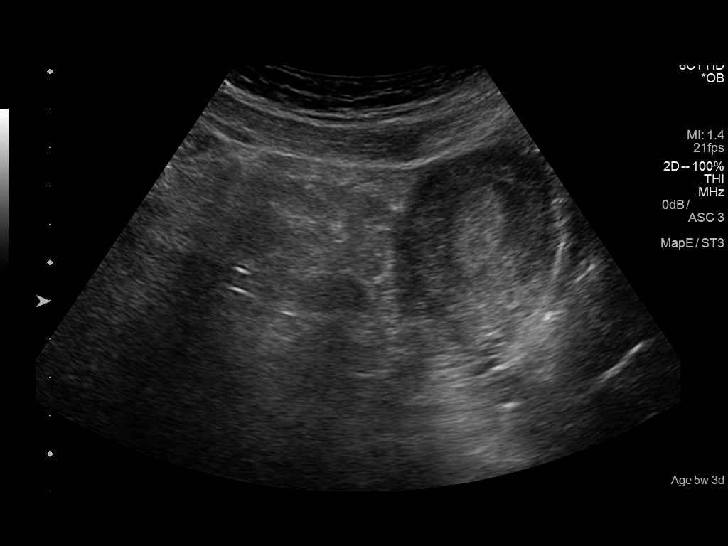
[im 33/98]
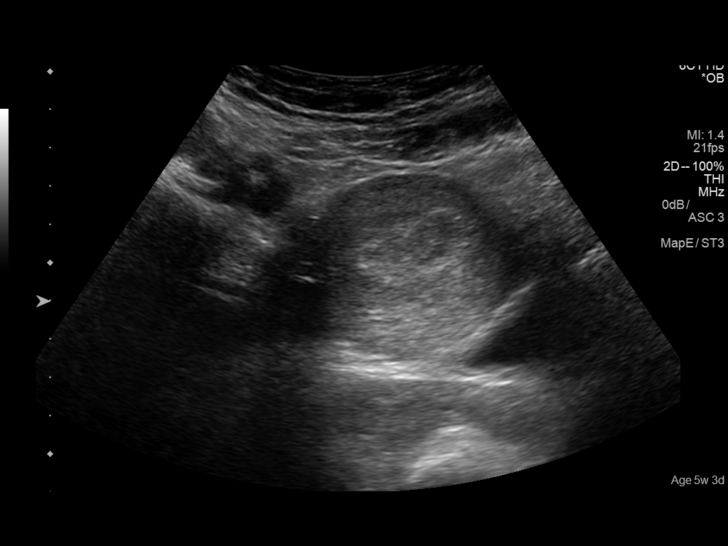
[im 40/98]
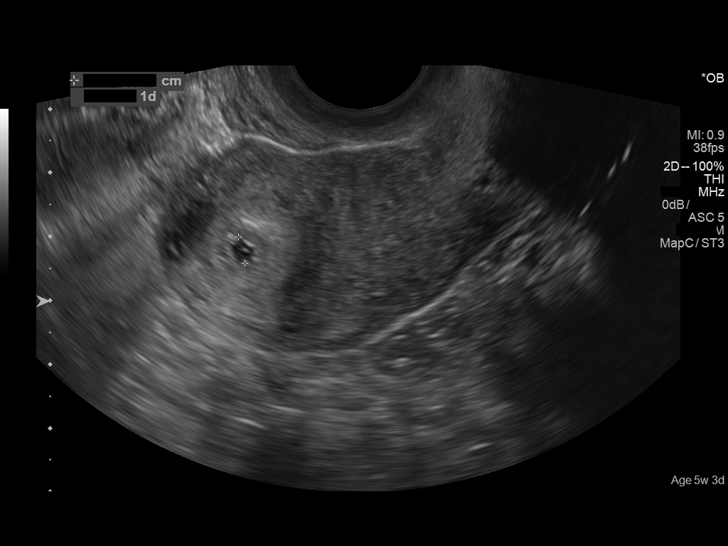
[im 51/98]
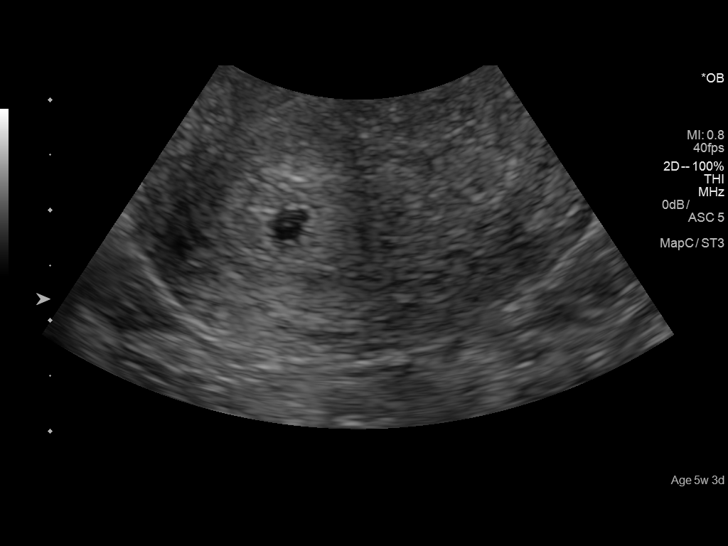
[im 58/98]
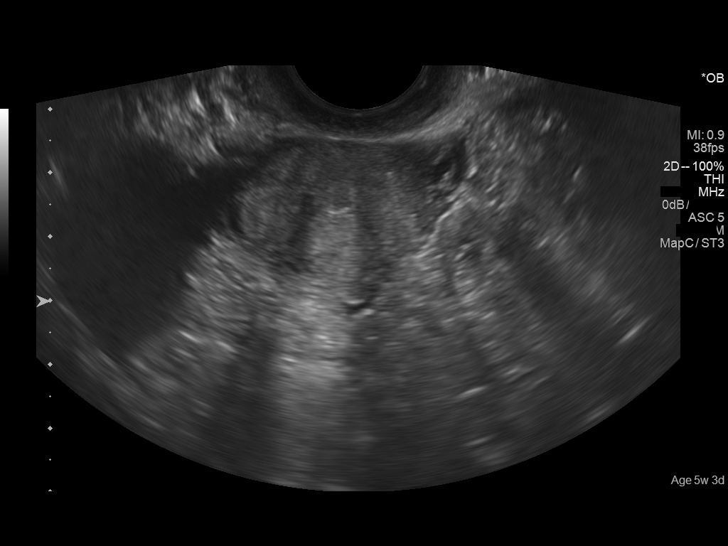
[im 65/98]
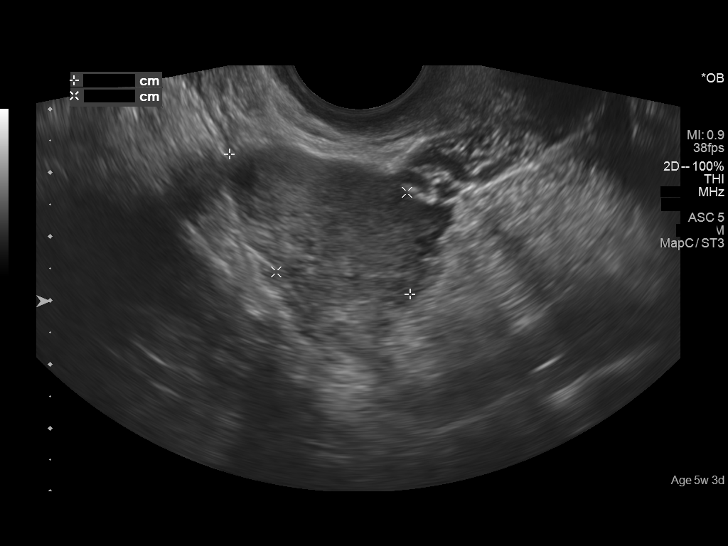
[im 72/98]
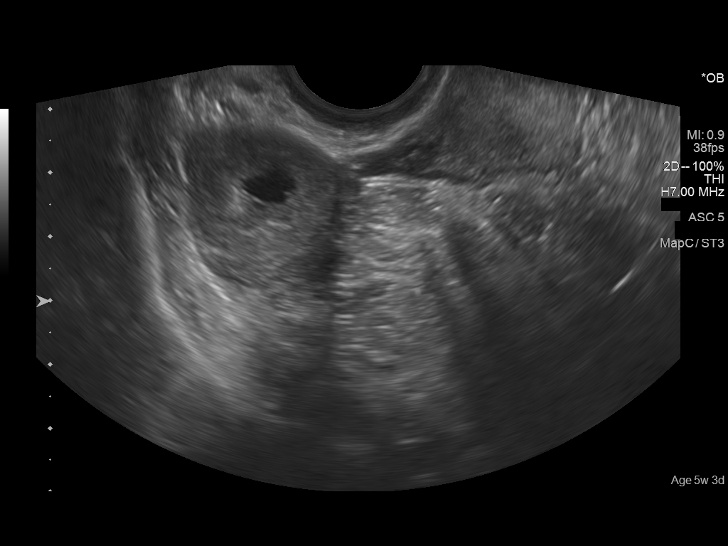
[im 80/98]
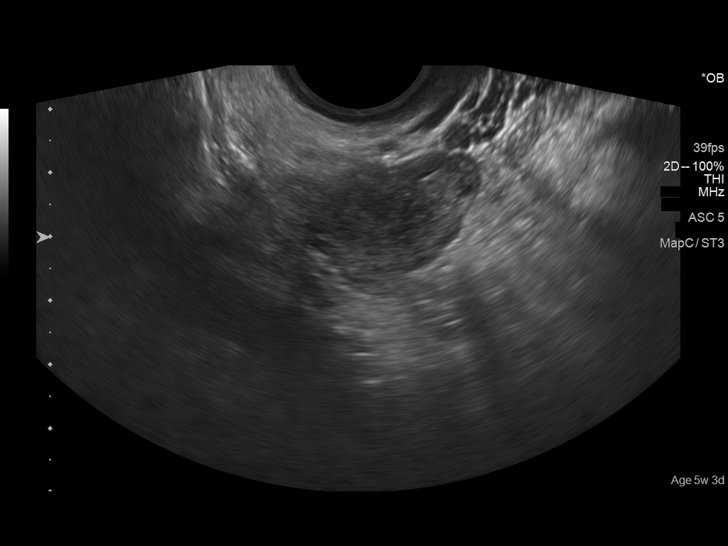
[im 87/98]
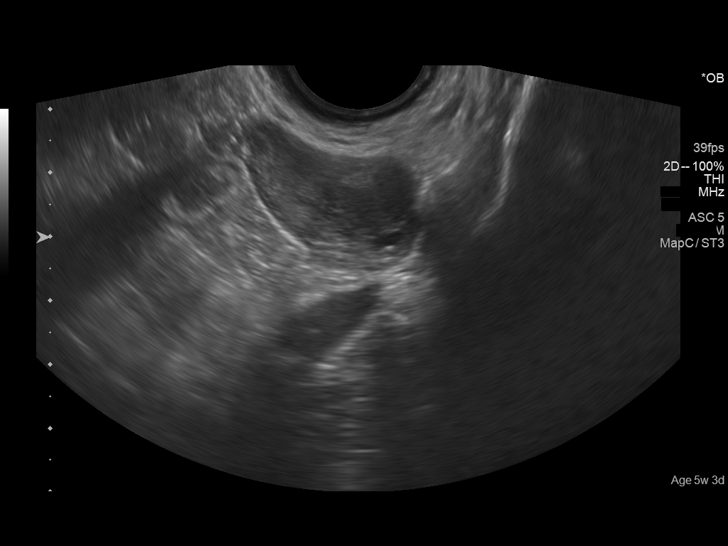
[im 94/98]
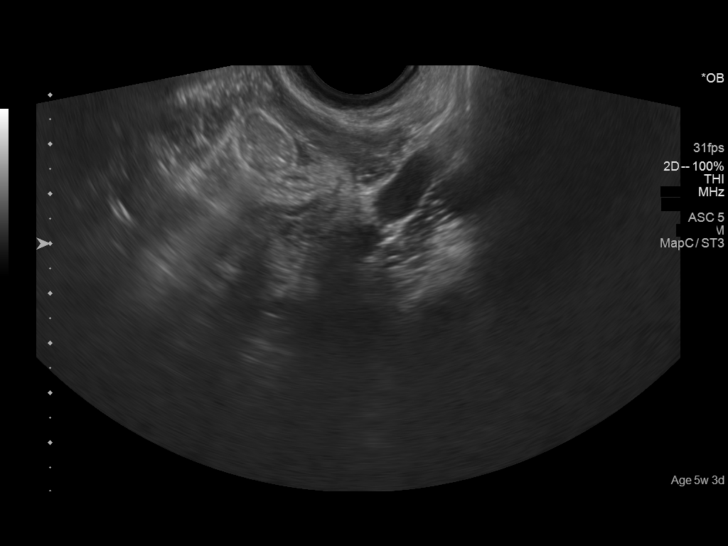

[13 of 28 positions shown; findings below may reference images not displayed]

FINDINGS: Intrauterine gestational sac: Single

Yolk sac:  Not visible

Embryo:  Not visible

Cardiac Activity: Not detected

Heart Rate: Not applicable  bpm

MSD: 3.3  mm   5 w   0  d

Subchorionic hemorrhage:  None visualized.

Maternal uterus/adnexae: No pelvic free fluid.

The right ovary measures 3.6 x 2.4 by 2.1 cm. There is a mildly
complex cyst within the right ovary measuring 1.8 cm without
hypervascularity (image 66). The left ovary measures 2.8 x 2.0 by
2.0 cm and also contains a mildly complex area measuring 1.5 cm
without hypervascularity (image 85).
IMPRESSION: Probable early intrauterine gestational sac, but no yolk sac, fetal
pole, or cardiac activity yet visualized. Recommend follow-up
quantitative B-HCG levels and follow-up US in 14 days to assess
viability. This recommendation follows SRU consensus guidelines:
Diagnostic Criteria for Nonviable Pregnancy Early in the First
Trimester. N Engl J Med 8230; [DATE].

## 2019-02-06 IMAGING — US US OB TRANSVAGINAL
1 series · 14 of 28 positions shown · non-contrast
Comparison: 07/21/2016 ultrasound

CLINICAL DATA: Vaginal bleeding 1 week ago which has stopped. Could
not confirm intrauterine pregnancy at that time. Follow-up.

EXAM:
TRANSVAGINAL OB ULTRASOUND
TECHNIQUE: Transvaginal ultrasound was performed for complete evaluation of the
gestation as well as the maternal uterus, adnexal regions, and
pelvic cul-de-sac.

[Series 1: us ob transvaginal · 0.10mm/px · 70 acquisitions, 14 frames shown]
[im 3/70]
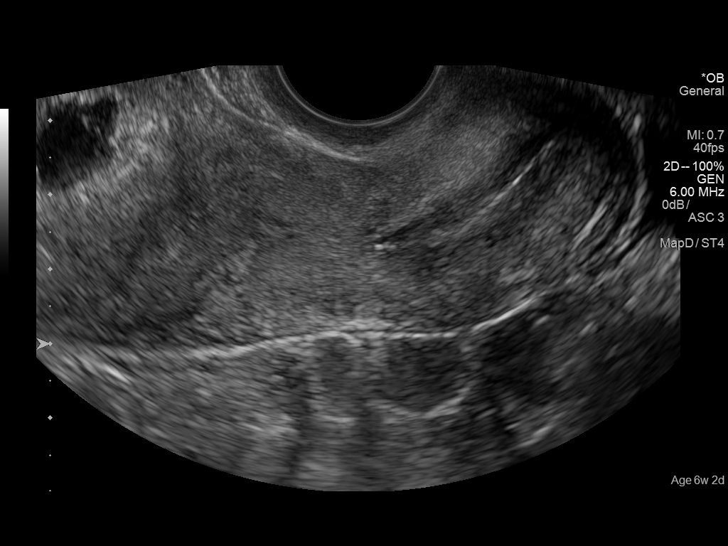
[im 8/70]
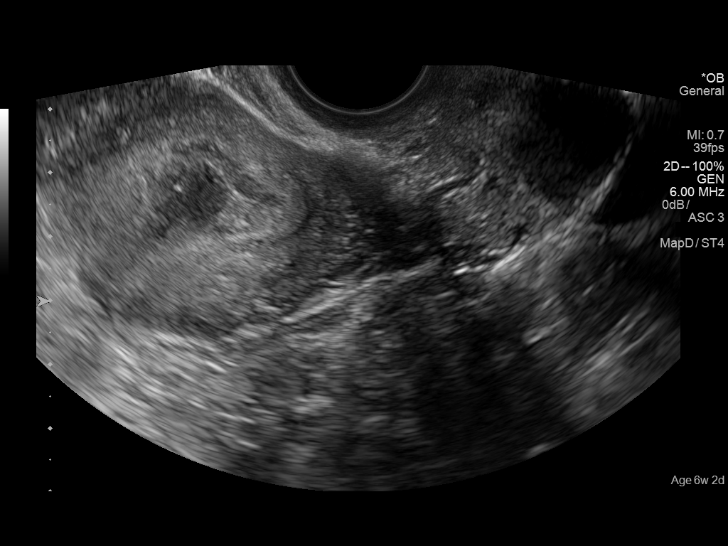
[im 13/70]
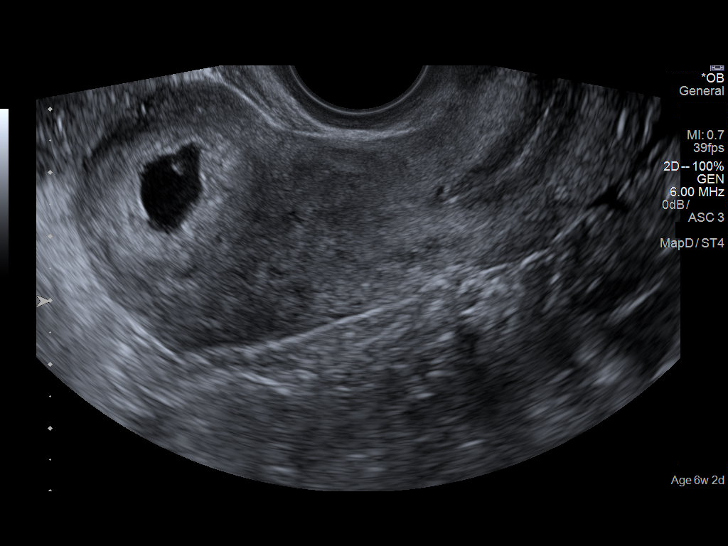
[im 18/70]
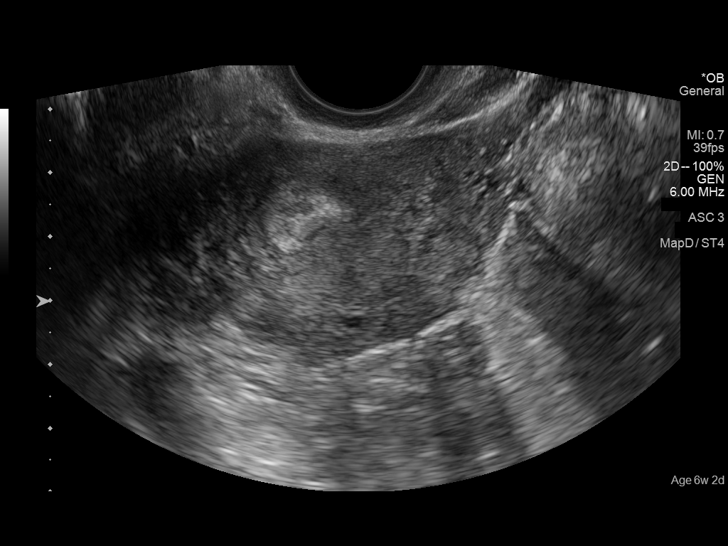
[im 24/70]
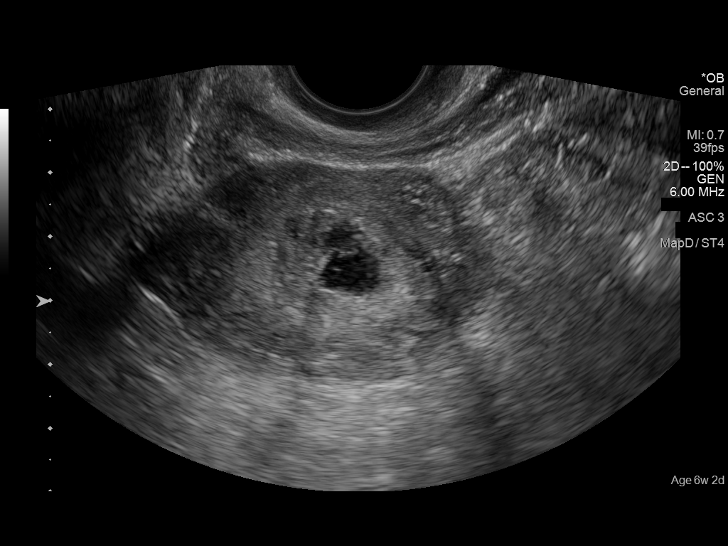
[im 29/70]
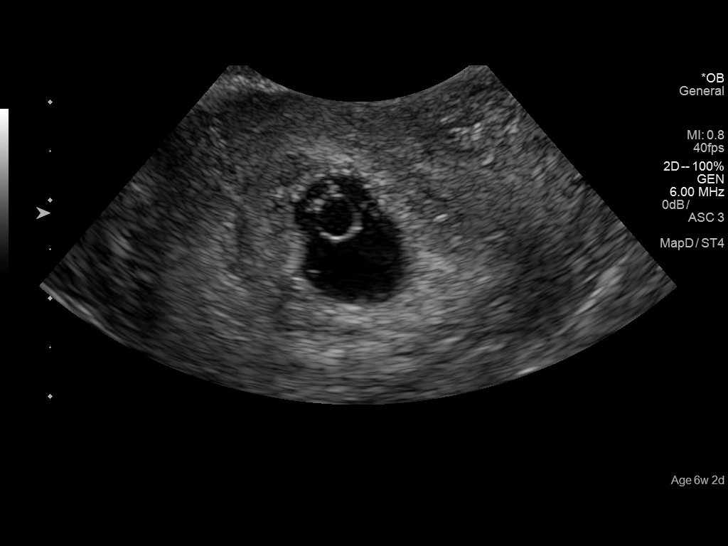
[im 34/70]
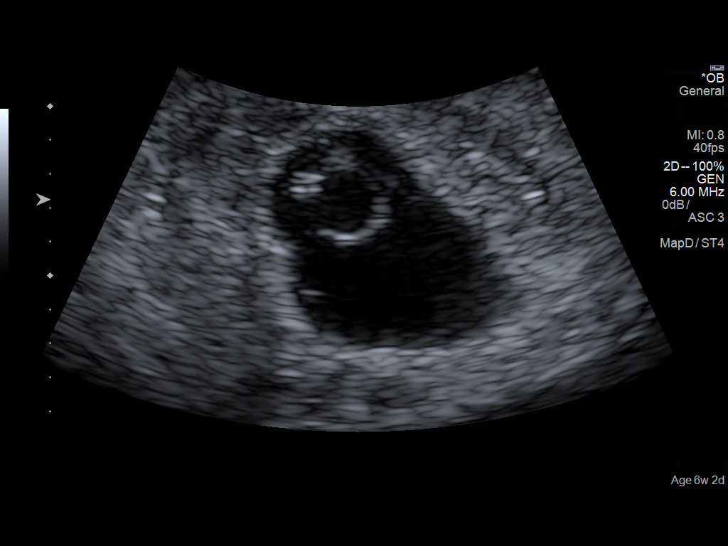
[im 39/70]
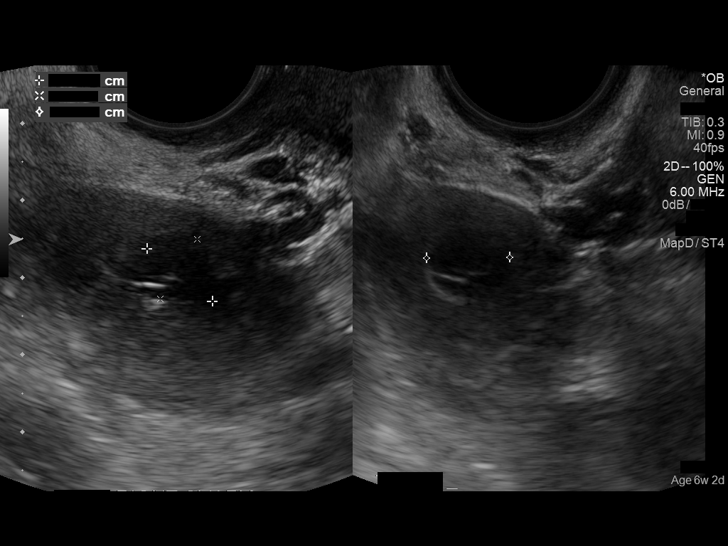
[im 44/70]
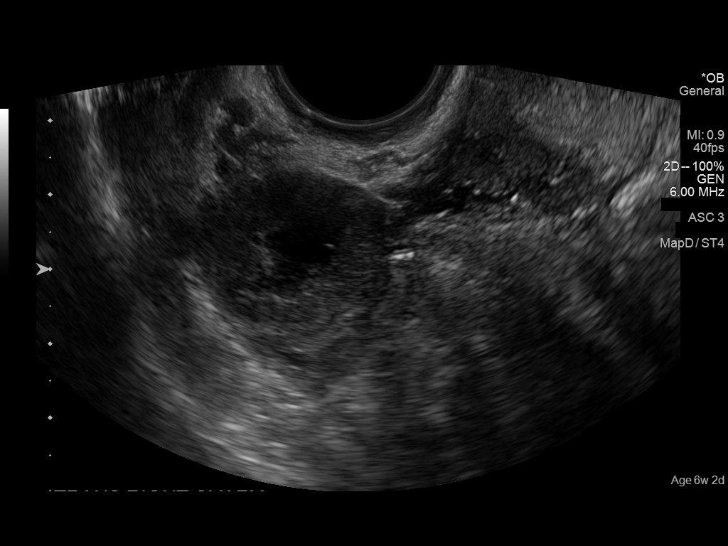
[im 49/70]
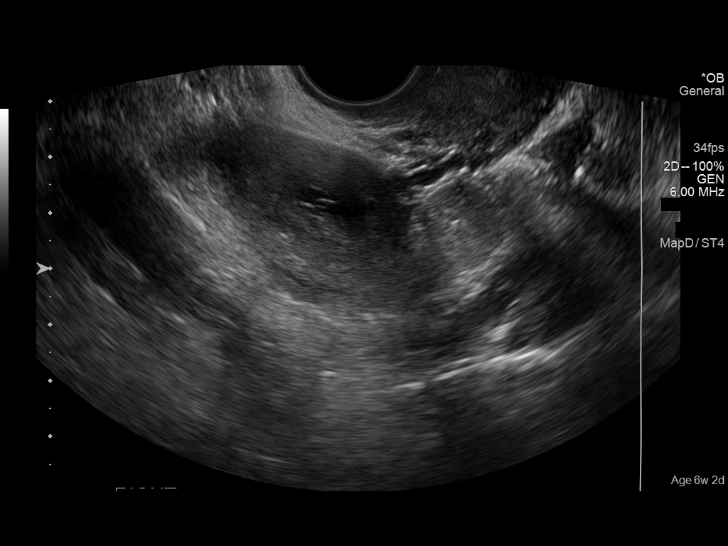
[im 54/70]
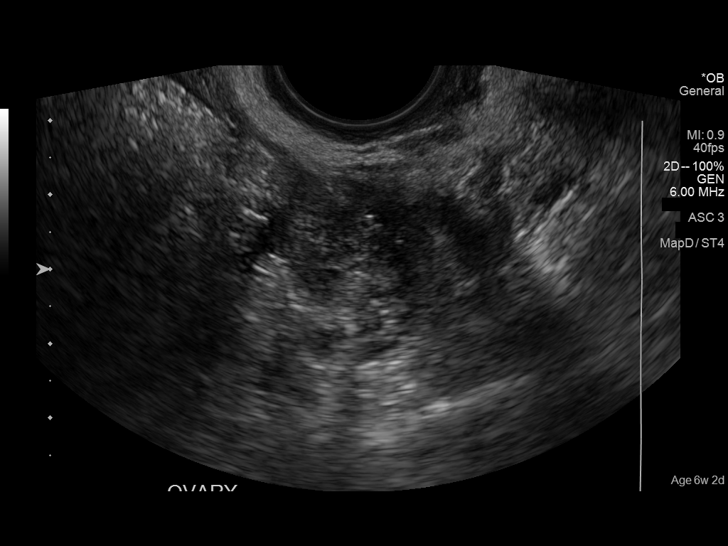
[im 59/70]
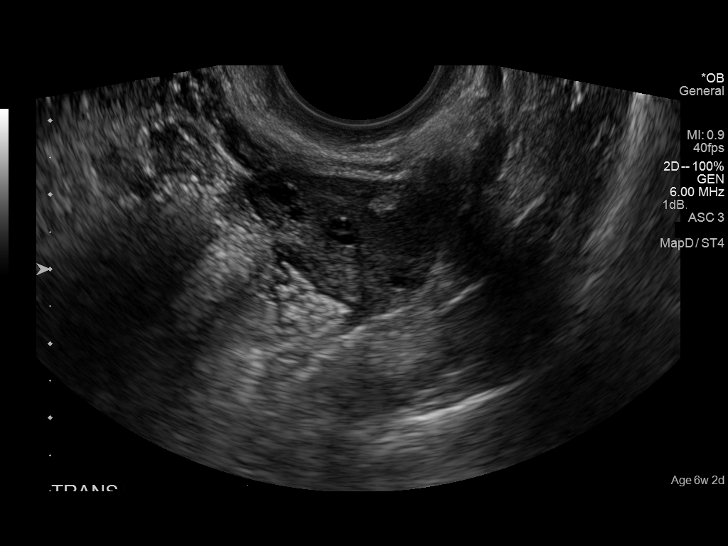
[im 64/70]
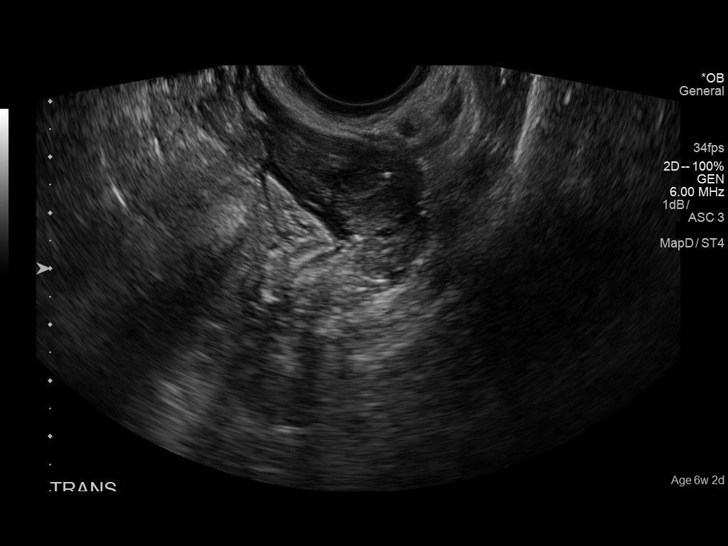
[im 70/70]
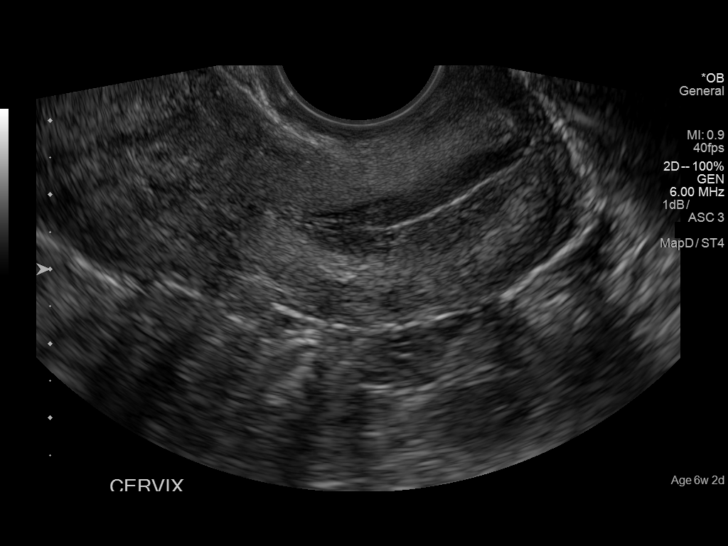

[14 of 28 positions shown; findings below may reference images not displayed]

FINDINGS: Intrauterine gestational sac: Single

Yolk sac:  Visualized.

Embryo:  Visualized.

Cardiac Activity: Visualized.

Heart Rate: 108 bpm

CRL:   2.6  mm   5 w 6 d                  US EDC: 03/25/2017

Subchorionic hemorrhage:  None visualized.

Maternal uterus/adnexae: Corpus luteum on the right. Normal left
ovary.
IMPRESSION: Single live intrauterine gestation at 5 weeks 6 days. Ultrasound EDC
03/25/2017.
